# Patient Record
Sex: Male | Born: 1975 | Race: Black or African American | Hispanic: No | Marital: Single | Smoking: Never smoker
Health system: Southern US, Community
[De-identification: ages and names within clinical notes are randomized; demographics above are authoritative.]

## PROBLEM LIST (undated history)

## (undated) DIAGNOSIS — I10 Essential (primary) hypertension: Secondary | ICD-10-CM

## (undated) DIAGNOSIS — R7303 Prediabetes: Secondary | ICD-10-CM

---

## 2005-11-01 ENCOUNTER — Emergency Department (HOSPITAL_COMMUNITY): Admission: EM | Admit: 2005-11-01 | Discharge: 2005-11-01 | Payer: Self-pay | Admitting: Emergency Medicine

## 2007-03-15 ENCOUNTER — Emergency Department (HOSPITAL_COMMUNITY): Admission: EM | Admit: 2007-03-15 | Discharge: 2007-03-15 | Payer: Self-pay | Admitting: Emergency Medicine

## 2008-01-20 ENCOUNTER — Emergency Department (HOSPITAL_COMMUNITY): Admission: EM | Admit: 2008-01-20 | Discharge: 2008-01-20 | Payer: Self-pay | Admitting: Family Medicine

## 2010-03-29 ENCOUNTER — Emergency Department (HOSPITAL_COMMUNITY)
Admission: EM | Admit: 2010-03-29 | Discharge: 2010-03-29 | Payer: Self-pay | Source: Home / Self Care | Admitting: Family Medicine

## 2010-12-09 LAB — POCT I-STAT, CHEM 8
BUN: 15 mg/dL (ref 6–23)
Calcium, Ion: 1.23 mmol/L (ref 1.12–1.32)
Chloride: 108 mEq/L (ref 96–112)
Creatinine, Ser: 1.1 mg/dL (ref 0.4–1.5)
Glucose, Bld: 93 mg/dL (ref 70–99)
HCT: 44 % (ref 39.0–52.0)

## 2015-05-22 ENCOUNTER — Emergency Department (HOSPITAL_COMMUNITY)
Admission: EM | Admit: 2015-05-22 | Discharge: 2015-05-22 | Disposition: A | Discharge status: Home / Self Care | Payer: Self-pay | Attending: Emergency Medicine | Admitting: Emergency Medicine

## 2015-05-22 ENCOUNTER — Encounter (HOSPITAL_COMMUNITY): Payer: Self-pay | Admitting: Emergency Medicine

## 2015-05-22 DIAGNOSIS — R51 Headache: Secondary | ICD-10-CM | POA: Insufficient documentation

## 2015-05-22 NOTE — ED Notes (Signed)
Per pt, states headaches for a week-has not been taking anything for them

## 2015-05-25 ENCOUNTER — Emergency Department (HOSPITAL_COMMUNITY)
Admission: EM | Admit: 2015-05-25 | Discharge: 2015-05-26 | Disposition: A | Discharge status: Home / Self Care | Payer: Self-pay | Attending: Emergency Medicine | Admitting: Emergency Medicine

## 2015-05-25 ENCOUNTER — Encounter (HOSPITAL_COMMUNITY): Payer: Self-pay | Admitting: Emergency Medicine

## 2015-05-25 ENCOUNTER — Emergency Department (HOSPITAL_COMMUNITY): Payer: Self-pay

## 2015-05-25 ENCOUNTER — Emergency Department (INDEPENDENT_AMBULATORY_CARE_PROVIDER_SITE_OTHER)
Admission: EM | Admit: 2015-05-25 | Discharge: 2015-05-25 | Disposition: A | Discharge status: Home / Self Care | Payer: Self-pay | Source: Home / Self Care | Attending: Emergency Medicine | Admitting: Emergency Medicine

## 2015-05-25 ENCOUNTER — Encounter (HOSPITAL_COMMUNITY): Payer: Self-pay | Admitting: *Deleted

## 2015-05-25 DIAGNOSIS — G44019 Episodic cluster headache, not intractable: Secondary | ICD-10-CM | POA: Insufficient documentation

## 2015-05-25 DIAGNOSIS — G4452 New daily persistent headache (NDPH): Secondary | ICD-10-CM

## 2015-05-25 MED ORDER — METOCLOPRAMIDE HCL 10 MG PO TABS: 10.0000 mg | ORAL_TABLET | Freq: Three times a day (TID) | ORAL | Status: DC | PRN

## 2015-05-25 MED ORDER — IBUPROFEN 800 MG PO TABS
ORAL_TABLET | ORAL | Status: AC
  Filled 2015-05-25: qty 1

## 2015-05-25 MED ORDER — IBUPROFEN 800 MG PO TABS
800.0000 mg | ORAL_TABLET | Freq: Once | ORAL | Status: AC
  Administered 2015-05-25: 800 mg via ORAL

## 2015-05-25 MED ORDER — OXYCODONE-ACETAMINOPHEN 5-325 MG PO TABS
1.0000 | ORAL_TABLET | Freq: Once | ORAL | Status: AC
Start: 2015-05-25 — End: 2015-05-25
  Administered 2015-05-25: 1 via ORAL

## 2015-05-25 MED ORDER — OXYCODONE-ACETAMINOPHEN 5-325 MG PO TABS
ORAL_TABLET | ORAL | Status: AC
  Filled 2015-05-25: qty 1

## 2015-05-25 NOTE — ED Provider Notes (Signed)
CSN: [id number]     Arrival date & time 05/25/15  1551 History   First MD Initiated Contact with Patient 05/25/15 1720     Chief Complaint  Patient presents with  . Headache   (Consider location/radiation/quality/duration/timing/severity/associated sxs/prior Treatment) HPI He is a 40 year old man here for evaluation of headache.  He states the headache started about a week ago. Initially it would, and go, but it has been constant for the last 3-4 days. It is located in the right temple. He states it feels like someone is drooling in his skull. The pain radiates throughout the right side of his head and into his right shoulder. He states the pain will make his eye water. He reports sensitivity to light and noises. He does report nausea. He states when the pain gets intense, he has difficulty swallowing. Sometimes sleeping will improve the headache, but other times the headache will wake him up. He denies any change in his vision. No difficulty with speech. He does report feeling a little off balance a couple times this week. No focal numbness, tingling, weakness. No personal or family history of migraine headaches. He states he has had sinus headaches in the past, but this is very different. He has tried [facility] powders without improvement.  History reviewed. No pertinent past medical history. History reviewed. No pertinent past surgical history. No family history on file. Social History  Substance Use Topics  . Smoking status: Never Smoker   . Smokeless tobacco: None  . Alcohol Use: No    Review of Systems As in history of present illness Allergies  Review of patient's allergies indicates no known allergies.  Home Medications   Prior to Admission medications   Not on File   Meds Ordered and Administered this Visit   Medications  ibuprofen (ADVIL,MOTRIN) tablet 800 mg (800 mg Oral Given 05/25/15 1747)    BP 156/81 mmHg  Pulse 60  Temp(Src) 98.1 F (36.7 C) (Oral)  Resp 14  SpO2  98% No data found.   Physical Exam  Constitutional: He is oriented to person, place, and time. He appears well-developed and well-nourished. No distress.  HENT:  Mouth/Throat: Oropharynx is clear and moist. No oropharyngeal exudate.  Eyes: Conjunctivae and EOM are normal. Pupils are equal, round, and reactive to light.  Neck: Neck supple.  Cardiovascular: Normal rate, regular rhythm and normal heart sounds.   No murmur heard. Pulmonary/Chest: Effort normal and breath sounds normal. No respiratory distress. He has no wheezes. He has no rales.  Neurological: He is alert and oriented to person, place, and time. No cranial nerve deficit. He exhibits normal muscle tone. Coordination normal.  Romberg negative    ED Course  Procedures (including critical care time)  Labs Review Labs Reviewed - No data to display  Imaging Review No results found.   MDM   1. New daily persistent headache    Discussed with patient that new onset migraine in a 40 year old man is somewhat unusual. Given the new headache, swallowing and gait issues he has experienced, will transfer to ER for additional evaluation with a head CT. He is agreeable with this plan. Ibuprofen 800 mg given prior to transfer.    Charm RingsErin J Honig, MD 05/25/15 [phone]

## 2015-05-25 NOTE — Discharge Instructions (Signed)
Cluster Headache Cluster headaches are deeply painful. They normally occur on one side of your head, but they may switch sides. Often, cluster headaches:  Are severe.  Happen often for a few weeks or months and then go away for a while.  Last from 15 minutes to 3 hours.  Happen at the same time each day.  Happen at night.  Happen many times a day. HOME CARE  During times when you have cluster headaches:  Get the same amount of sleep every night, at the same time each night.  Avoid alcohol.  Stop smoking if you smoke. GET HELP IF:  There are changes in how bad or how often your headaches happen.  Your medicines are not helping. GET HELP RIGHT AWAY IF:  You pass out (faint).  You become weak or lose feeling (have numbness) on one side of your body or face.  You see two of everything (double vision).  You feel sick to your stomach (nauseous) or throw up (vomit) and do not stop after several hours.  You are off balance or have trouble talking or walking.  You have neck pain or stiffness.  You have a fever. MAKE SURE YOU:  Understand these instructions.  Will watch your condition.  Will get help right away if you are not doing well or get worse.   This information is not intended to replace advice given to you by your health care provider. Make sure you discuss any questions you have with your health care provider.   Document Released: 04/02/2004 Document Revised: 03/16/2014 Document Reviewed: 09/15/2012 Elsevier Interactive Patient Education 2016 [employer]  

## 2015-05-25 NOTE — ED Notes (Signed)
C/o intermittent HAs onset x7 days... Pain increases w/bright light and loud noises... Pain is mostly on right side A&O x4... No acute distress.

## 2015-05-25 NOTE — ED Provider Notes (Signed)
CSN: [id number]     Arrival date & time 05/25/15  1755 History   First MD Initiated Contact with Patient 05/25/15 2043     Chief Complaint  Patient presents with  . Headache     (Consider location/radiation/quality/duration/timing/severity/associated sxs/prior Treatment) Patient is a 40 y.o. male presenting with headaches. The history is provided by the patient.  Headache Location: behing right eye. Quality:  Sharp Radiates to:  Face Severity currently:  8/10 Severity at highest:  10/10 Onset quality:  Sudden Duration:  1 week Timing:  Intermittent Progression:  Waxing and waning Chronicity:  New Similar to prior headaches: no   Relieved by:  Nothing Worsened by:  Nothing Ineffective treatments:  NSAIDs Associated symptoms: no abdominal pain, no back pain, no diarrhea, no dizziness, no eye pain, no fever, no myalgias, no nausea, no neck pain, no neck stiffness, no sore throat, no vomiting and no weakness   Associated symptoms comment:  + tearing of eye   History reviewed. No pertinent past medical history. History reviewed. No pertinent past surgical history. History reviewed. No pertinent family history. Social History  Substance Use Topics  . Smoking status: Never Smoker   . Smokeless tobacco: None  . Alcohol Use: No    Review of Systems  Constitutional: Negative for fever, diaphoresis, activity change and appetite change.  HENT: Negative for facial swelling, sore throat, tinnitus, trouble swallowing and voice change.   Eyes: Positive for discharge. Negative for pain and redness.  Respiratory: Negative for chest tightness, shortness of breath and wheezing.   Cardiovascular: Negative for chest pain, palpitations and leg swelling.  Gastrointestinal: Negative for nausea, vomiting, abdominal pain, diarrhea, constipation and abdominal distention.  Endocrine: Negative.   Genitourinary: Negative.  Negative for dysuria, decreased urine volume, scrotal swelling and testicular  pain.  Musculoskeletal: Negative for myalgias, back pain, gait problem, neck pain and neck stiffness.  Skin: Negative.  Negative for rash.  Neurological: Positive for headaches. Negative for dizziness, tremors and weakness.  Psychiatric/Behavioral: Negative for suicidal ideas, hallucinations and self-injury. The patient is not nervous/anxious.       Allergies  Review of patient's allergies indicates no known allergies.  Home Medications   Prior to Admission medications   Medication Sig Start Date End Date Taking? Authorizing Provider  Aspirin-Salicylamide-Caffeine (BC HEADACHE POWDER PO) Take 1 packet by mouth 2 (two) times daily as needed (headache).   Yes Historical Provider, MD  metoCLOPramide (REGLAN) 10 MG tablet Take 1 tablet (10 mg total) by mouth every 8 (eight) hours as needed (headaches). 05/25/15   Lula Olszewski, MD   BP 141/84 mmHg  Pulse 88  Temp(Src) 98.5 F (36.9 C) (Oral)  Resp 18  SpO2 99% Physical Exam  Constitutional: He is oriented to person, place, and time. He appears well-developed and well-nourished. He appears distressed (middle aged, fit male in minor distress from headache pain).  HENT:  Head: Normocephalic and atraumatic.  Right Ear: External ear normal.  Left Ear: External ear normal.  Nose: Nose normal.  Mouth/Throat: Oropharynx is clear and moist.  Eyes: Conjunctivae and EOM are normal. Pupils are equal, round, and reactive to light. No scleral icterus.  Neck: Normal range of motion. Neck supple. No JVD present. No tracheal deviation present. No thyromegaly present.  Cardiovascular: Normal rate and intact distal pulses.  Exam reveals no gallop and no friction rub.   No murmur heard. Pulmonary/Chest: Effort normal and breath sounds normal. No stridor. No respiratory distress. He has no wheezes. He has no rales.  Abdominal: Soft. He exhibits no distension. There is no tenderness. There is no rebound and no guarding.  Musculoskeletal: Normal range of  motion. He exhibits no edema or tenderness.  Neurological: He is alert and oriented to person, place, and time. No cranial nerve deficit. He exhibits normal muscle tone. Coordination normal.  5/5 strength in all 4 extremities. Sensation intact and normal in all 4 extremities. Normal gait. Normal finger to nose and heel to shin. Negative romberg.   Skin: Skin is warm and dry. No rash noted. He is not diaphoretic.  Psychiatric: He has a normal mood and affect. His behavior is normal.  Nursing note and vitals reviewed.   ED Course  Procedures (including critical care time) Labs Review Labs Reviewed - No data to display  Imaging Review Ct Head Wo Contrast  05/25/2015  CLINICAL DATA:  Acute onset of intermittent right-sided headaches. Initial encounter. EXAM: CT HEAD WITHOUT CONTRAST TECHNIQUE: Contiguous axial images were obtained from the base of the skull through the vertex without intravenous contrast. COMPARISON:  None. FINDINGS: There is no evidence of acute infarction, mass lesion, or intra- or extra-axial hemorrhage on CT. The posterior fossa, including the cerebellum, brainstem and fourth ventricle, is within normal limits. The third and lateral ventricles, and basal ganglia are unremarkable in appearance. The cerebral hemispheres are symmetric in appearance, with normal gray-white differentiation. No mass effect or midline shift is seen. There is no evidence of fracture; visualized osseous structures are unremarkable in appearance. The orbits are within normal limits. The paranasal sinuses and mastoid air cells are well-aerated. No significant soft tissue abnormalities are seen. IMPRESSION: Unremarkable noncontrast CT of the head. Electronically Signed   By: Roanna RaiderJeffery  Chang M.D.   On: 05/25/2015 22:52   I have personally reviewed and evaluated these images and lab results as part of my medical decision-making.   EKG Interpretation None      MDM   Final diagnoses:  Episodic cluster  headache, not intractable    The patient is a 40 year old male who denies any prior past medical history who presents after being referred from urgent care for headache. The patient reports for the past 7 days he has had daily headaches worse overnight often waking him up from sleep. He describes the pain as a sharp pain behind the right eye tearing. No neurologic deficits. Due to new onset headaches waking him up from sleep urgent care referred him to the emergency department for head CT to rule out intracranial pathology. Head CT shows no acute intracranial process. Feel patient's history is consistent with cluster headaches. He is given oxygen via a nonrebreather and after 15 minutes reports significant improvement in his headache. The patient is appropriate for treatment with Reglan and referral for primary care follow-up. Patient expresses understanding and agreeable to this plan. Standard ED return precautions given.  I estimate there is LOW risk for SUBARACHNOID HEMORRHAGE, MENINGITIS, INTRACRANIAL HEMORRHAGE, or SUBDURAL HEMATOMA for the patient's symptoms thus I consider the discharge disposition reasonable. We have discussed the diagnosis and risks, and we agree with discharging home to follow-up with their primary doctor. We also discussed returning to the Emergency Department immediately if new or worsening symptoms occur. We have discussed the symptoms which are most concerning (e.g., changing or worsening pain, weakness, vomiting, vision/hearing changes, fever) that necessitate immediate return.  Patient seen with attending, Dr. Jodi MourningZavitz, who oversaw clinical decision making.     Lula OlszewskiMike Anahi Belmar, MD 05/26/15 [id number]  Blane OharaJoshua Zavitz, MD 05/27/15 [phone]

## 2015-05-25 NOTE — ED Notes (Signed)
Pt reports having a headache x 1 week, sensitive to light and mild nausea. No relief with otc meds.

## 2015-05-25 NOTE — ED Notes (Signed)
Pt declined shuttle service 

## 2015-05-26 NOTE — ED Notes (Signed)
Pt reports feeling better now

## 2015-11-07 ENCOUNTER — Encounter (HOSPITAL_COMMUNITY): Payer: Self-pay | Admitting: Emergency Medicine

## 2015-11-07 ENCOUNTER — Ambulatory Visit (HOSPITAL_COMMUNITY)
Admission: EM | Admit: 2015-11-07 | Discharge: 2015-11-07 | Disposition: A | Discharge status: Home / Self Care | Payer: Self-pay | Attending: Family Medicine | Admitting: Family Medicine

## 2015-11-07 DIAGNOSIS — N410 Acute prostatitis: Secondary | ICD-10-CM

## 2015-11-07 HISTORY — DX: Essential (primary) hypertension: I10

## 2015-11-07 LAB — POCT URINALYSIS DIP (DEVICE)
Glucose, UA: NEGATIVE mg/dL
KETONES UR: NEGATIVE mg/dL
Leukocytes, UA: NEGATIVE
NITRITE: NEGATIVE
PH: 6 (ref 5.0–8.0)
PROTEIN: 100 mg/dL — AB
Specific Gravity, Urine: >=1.03 (ref 1.005–1.030)
Urobilinogen, UA: 2 mg/dL — ABNORMAL HIGH (ref 0.0–1.0)

## 2015-11-07 MED ORDER — CIPROFLOXACIN HCL 500 MG PO TABS: 500.0000 mg | ORAL_TABLET | Freq: Two times a day (BID) | ORAL | 0 refills | Status: DC

## 2015-11-07 NOTE — ED Provider Notes (Signed)
CSN: [id number]     Arrival date & time 11/07/15  1903 History   First MD Initiated Contact with Patient 11/07/15 1945     Chief Complaint  Patient presents with  . Urinary Frequency  . Burning with Urination   (Consider location/radiation/quality/duration/timing/severity/associated sxs/prior Treatment) Patient states he has been having some urinary frequency and his urine stream is not like usual.  He states he was checked recently for STD and was not called with any abnormal results and told he had normal test results. He is uncircumcised.  He states he has to get up out of bed more at night to void.   The history is provided by the patient.  Urinary Frequency  This is a new problem. The current episode started more than 2 days ago. The problem occurs constantly. The problem has not changed since onset.Nothing aggravates the symptoms. Nothing relieves the symptoms. He has tried nothing for the symptoms.    Past Medical History:  Diagnosis Date  . Hypertension    History reviewed. No pertinent surgical history. History reviewed. No pertinent family history. Social History  Substance Use Topics  . Smoking status: Never Smoker  . Smokeless tobacco: Never Used  . Alcohol use No    Review of Systems  Constitutional: Negative.   HENT: Negative.   Eyes: Negative.   Respiratory: Negative.   Cardiovascular: Negative.   Gastrointestinal: Negative.   Endocrine: Negative.   Genitourinary: Positive for frequency.  Musculoskeletal: Negative.   Skin: Negative.   Allergic/Immunologic: Negative.   Neurological: Negative.   Hematological: Negative.   Psychiatric/Behavioral: Negative.     Allergies  Review of patient's allergies indicates no known allergies.  Home Medications   Prior to Admission medications   Medication Sig Start Date End Date Taking? Authorizing Provider  Aspirin-Salicylamide-Caffeine (BC HEADACHE POWDER PO) Take 1 packet by mouth 2 (two) times daily as needed  (headache).   Yes Historical Provider, MD  metoCLOPramide (REGLAN) 10 MG tablet Take 1 tablet (10 mg total) by mouth every 8 (eight) hours as needed (headaches). 05/25/15   Lula OlszewskiMike Goebel, MD   Meds Ordered and Administered this Visit  Medications - No data to display  BP (!) 194/104 (BP Location: Right Arm)   Pulse 70   Temp 99.2 F (37.3 C) (Oral)   SpO2 98%  No data found.   Physical Exam  Constitutional: He appears well-developed and well-nourished.  HENT:  Head: Normocephalic and atraumatic.  Eyes: EOM are normal. Pupils are equal, round, and reactive to light.  Neck: Normal range of motion. Neck supple.  Cardiovascular: Normal rate, regular rhythm and normal heart sounds.   Pulmonary/Chest: Effort normal and breath sounds normal.  Abdominal: Soft. Bowel sounds are normal.  Genitourinary: Penis normal.  Nursing note and vitals reviewed.   Urgent Care Course   Clinical Course    Procedures (including critical care time)  Labs Review Labs Reviewed - No data to display  Imaging Review No results found.   Visual Acuity Review  Right Eye Distance:   Left Eye Distance:   Bilateral Distance:    Right Eye Near:   Left Eye Near:    Bilateral Near:         MDM  Prostatitis - Cipro 500mg  one po bid x 14 days #28 Urine results are normal and advised patient to get PCP and follow up.     Deatra CanterWilliam J Oxford, FNP 11/07/15 2009

## 2015-11-07 NOTE — ED Triage Notes (Signed)
Pt reports having some burning with urination, an odor with his urine, frequency of urination and some mld bilateral back pain.  He states his girlfriend has a UTI , but no STD's.

## 2015-11-07 NOTE — ED Notes (Signed)
Pt given AVS and prescription.  Pt encouraged to return to PCP for follow up on his HBP.  Pt stated understanding.

## 2016-12-30 IMAGING — CT CT HEAD W/O CM
2 series · 15 of 30 positions shown, 19 images · non-contrast
Comparison: None.

CLINICAL DATA: Acute onset of intermittent right-sided headaches.
Initial encounter.

EXAM:
CT HEAD WITHOUT CONTRAST
TECHNIQUE: Contiguous axial images were obtained from the base of the skull
through the vertex without intravenous contrast.

[Series 201: head w/o, idose (1) · axial · non-contrast · 0.49mm/px · z∈[+82,+212]mm · 13 of 32 slices shown, 17 images]
[im 3/32  brain]
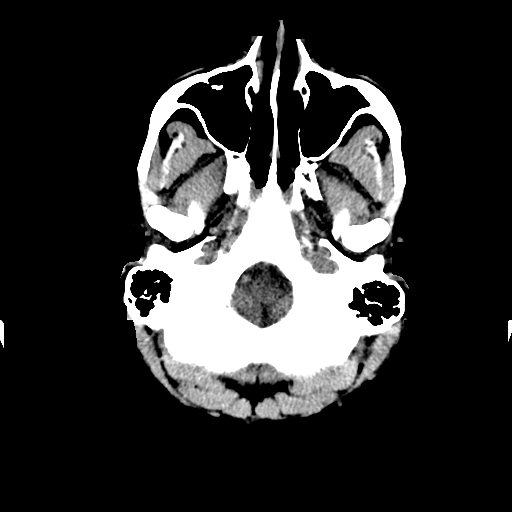
[im 3/32  bone]
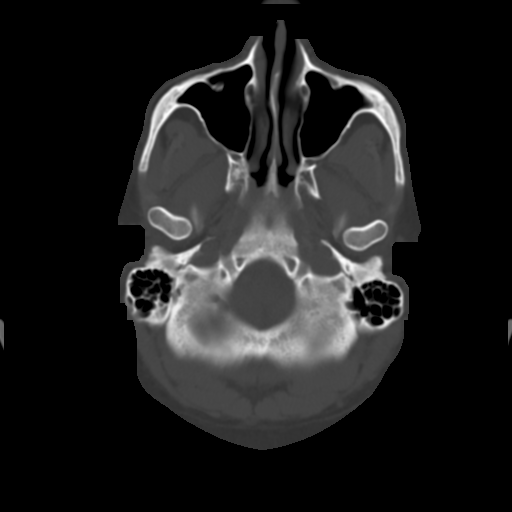
[im 5/32  brain]
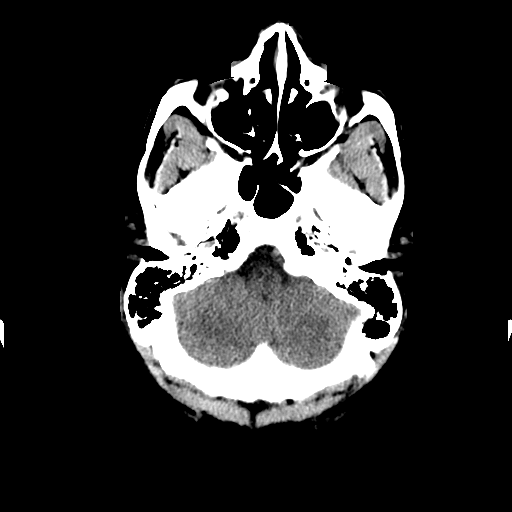
[im 7/32  brain]
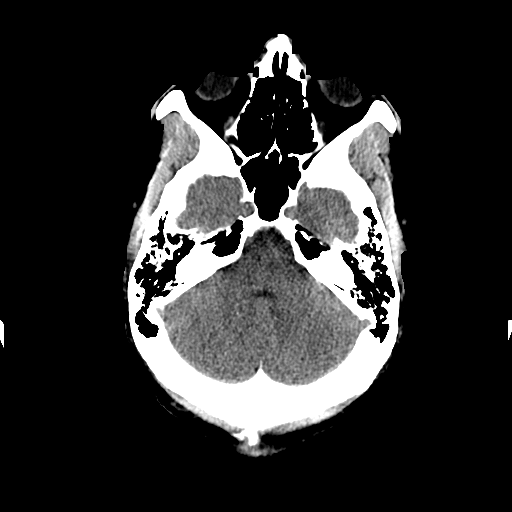
[im 9/32  brain]
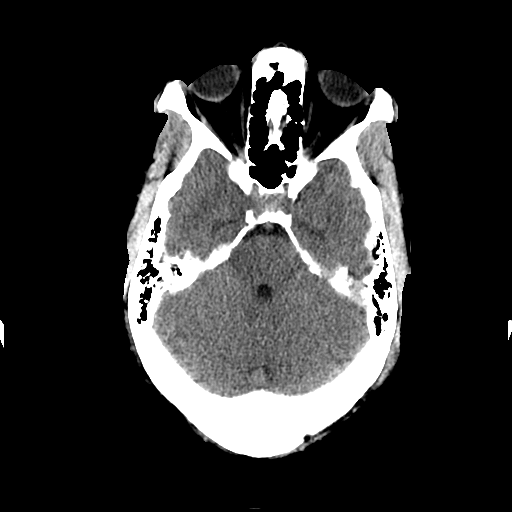
[im 12/32  brain]
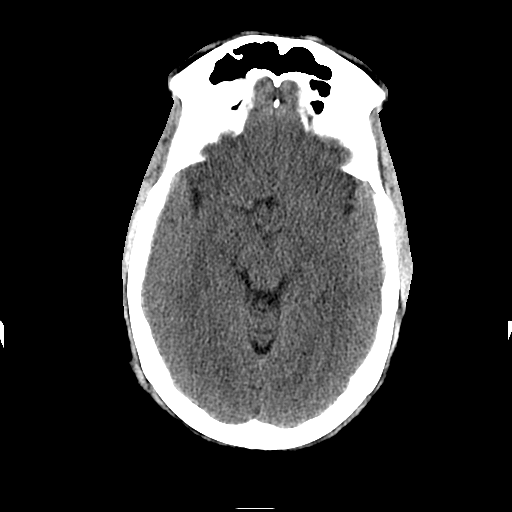
[im 12/32  bone]
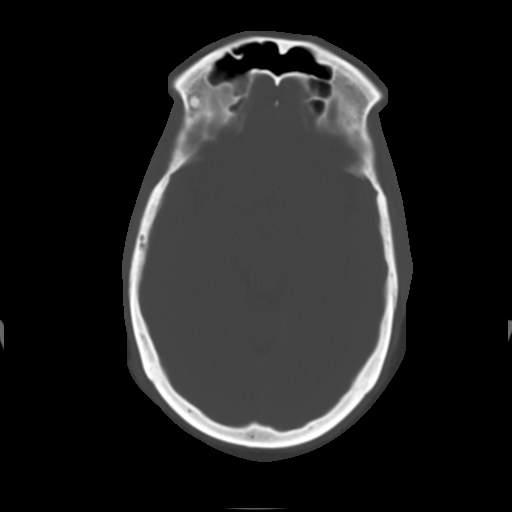
[im 14/32  brain]
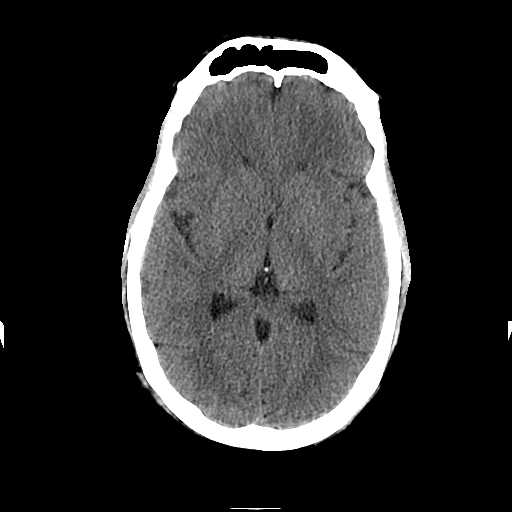
[im 16/32  brain]
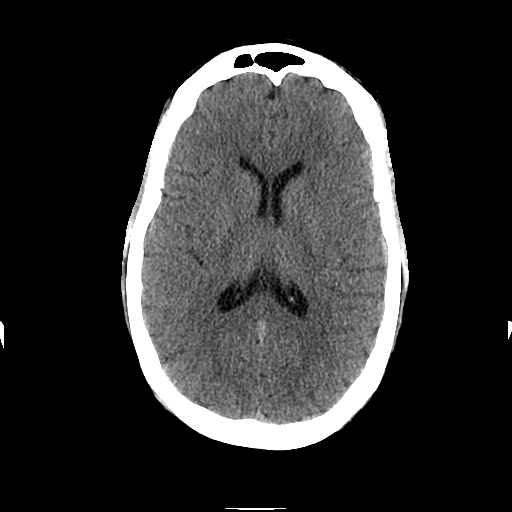
[im 18/32  brain]
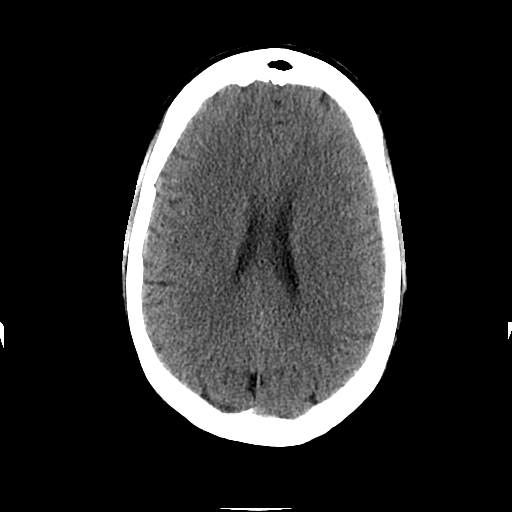
[im 20/32  brain]
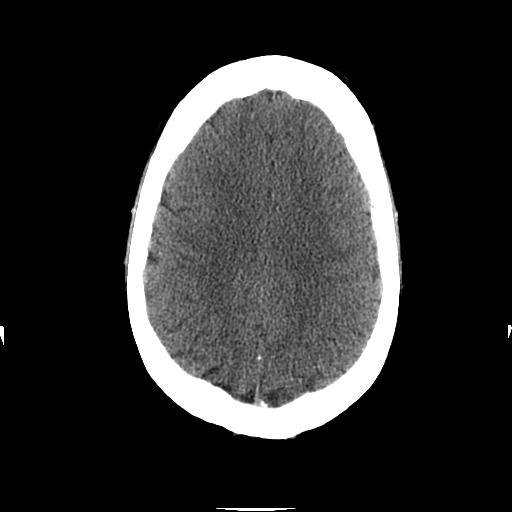
[im 20/32  bone]
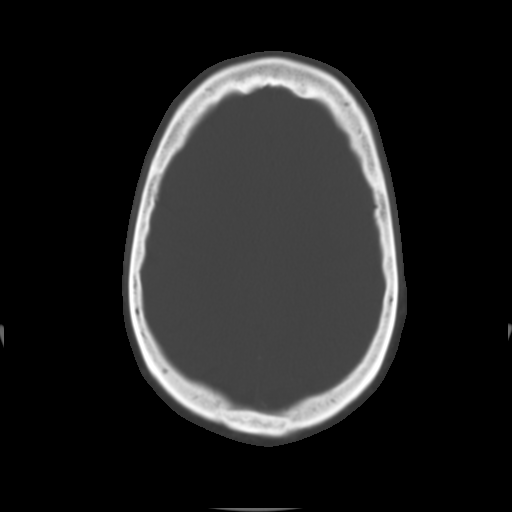
[im 23/32  brain]
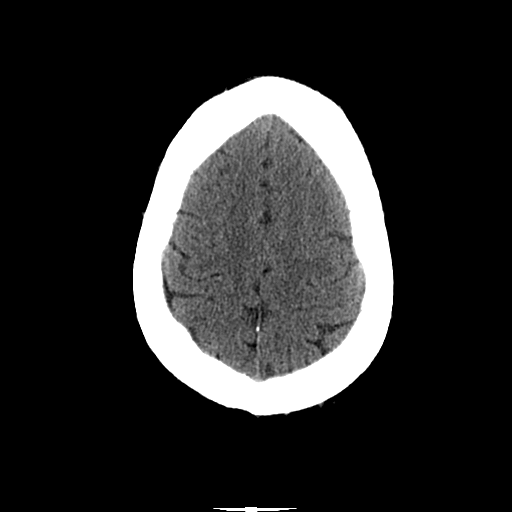
[im 25/32  brain]
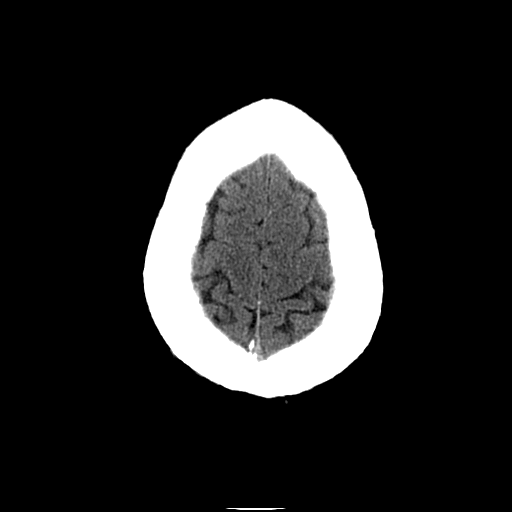
[im 27/32  brain]
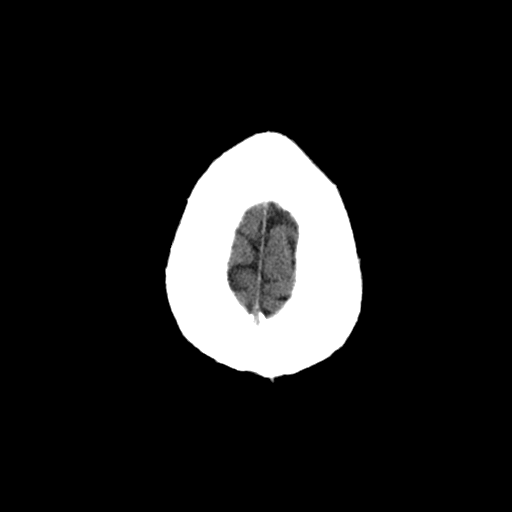
[im 29/32  brain]
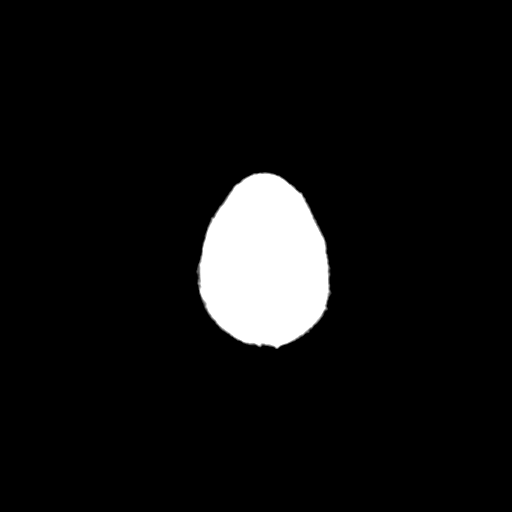
[im 29/32  bone]
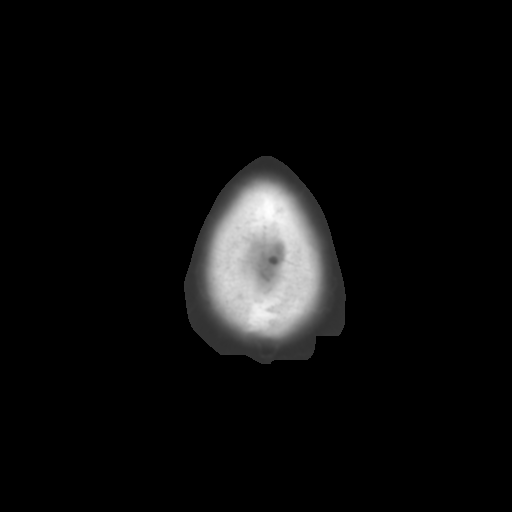

[Series 202: head w/o bone, idose (1) · axial · non-contrast · 0.49mm/px · z∈[+82,+102]mm · 2 of 32 slices shown]
[im 3/32  bone]
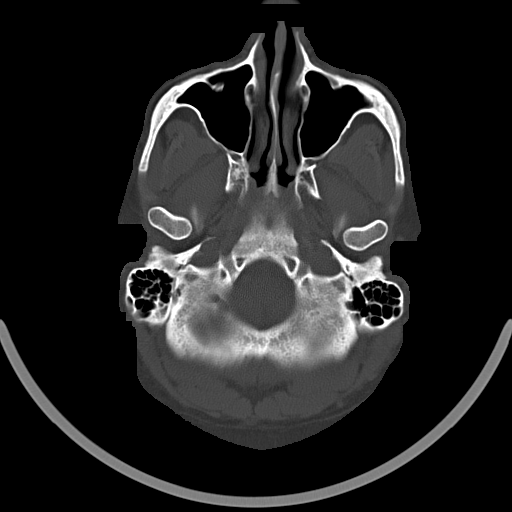
[im 7/32  bone]
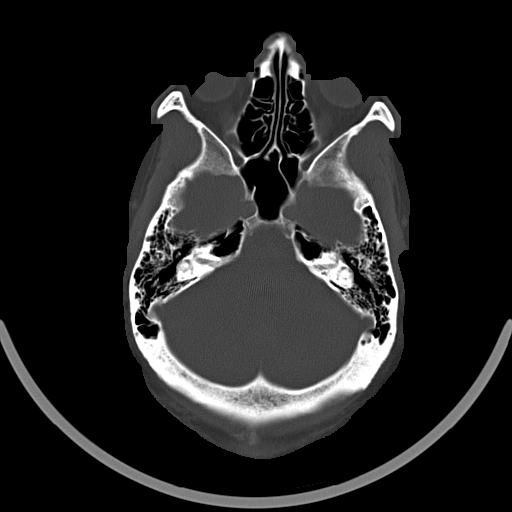

[15 of 30 positions shown; findings below may reference images not displayed]

FINDINGS: There is no evidence of acute infarction, mass lesion, or intra- or
extra-axial hemorrhage on CT.

The posterior fossa, including the cerebellum, brainstem and fourth
ventricle, is within normal limits. The third and lateral
ventricles, and basal ganglia are unremarkable in appearance. The
cerebral hemispheres are symmetric in appearance, with normal
gray-white differentiation. No mass effect or midline shift is seen.

There is no evidence of fracture; visualized osseous structures are
unremarkable in appearance. The orbits are within normal limits. The
paranasal sinuses and mastoid air cells are well-aerated. No
significant soft tissue abnormalities are seen.
IMPRESSION: Unremarkable noncontrast CT of the head.

## 2018-08-07 ENCOUNTER — Ambulatory Visit (HOSPITAL_COMMUNITY)
Admission: EM | Admit: 2018-08-07 | Discharge: 2018-08-07 | Disposition: A | Discharge status: Home / Self Care | Payer: Self-pay | Attending: Family Medicine | Admitting: Family Medicine

## 2018-08-07 ENCOUNTER — Other Ambulatory Visit: Payer: Self-pay

## 2018-08-07 ENCOUNTER — Encounter (HOSPITAL_COMMUNITY): Payer: Self-pay | Admitting: Emergency Medicine

## 2018-08-07 DIAGNOSIS — R369 Urethral discharge, unspecified: Secondary | ICD-10-CM

## 2018-08-07 MED ORDER — CEFTRIAXONE SODIUM 250 MG IJ SOLR
INTRAMUSCULAR | Status: AC
  Filled 2018-08-07: qty 250

## 2018-08-07 MED ORDER — AZITHROMYCIN 250 MG PO TABS
ORAL_TABLET | ORAL | Status: AC
Start: 2018-08-07 — End: ?
  Filled 2018-08-07: qty 4

## 2018-08-07 MED ORDER — CEFTRIAXONE SODIUM 250 MG IJ SOLR
250.0000 mg | Freq: Once | INTRAMUSCULAR | Status: AC
  Administered 2018-08-07: 13:00:00 250 mg via INTRAMUSCULAR

## 2018-08-07 MED ORDER — AZITHROMYCIN 250 MG PO TABS
1000.0000 mg | ORAL_TABLET | Freq: Once | ORAL | Status: AC
  Administered 2018-08-07: 13:00:00 1000 mg via ORAL

## 2018-08-07 NOTE — Discharge Instructions (Addendum)
Avoid sex until next weekend.  We'll call you with the final diagnosis in a couple days.

## 2018-08-07 NOTE — ED Provider Notes (Signed)
[facility]    CSN: [id number] Arrival date & time: 08/07/18  1147     History   Chief Complaint Chief Complaint  Patient presents with  . Penile Discharge    HPI Benjamin Phillips is a 43 y.o. male.   Established [facility] patient  44 yo man with penile discharge.  It began 4 days ago but became heavier and thicker x 2 days.  New partner recently.  Works as a Investment banker, operational     Past Medical History:  Diagnosis Date  . Hypertension     There are no active problems to display for this patient.   History reviewed. No pertinent surgical history.     Home Medications    Prior to Admission medications   Medication Sig Start Date End Date Taking? Authorizing Provider  Aspirin-Salicylamide-Caffeine (BC HEADACHE POWDER PO) Take 1 packet by mouth 2 (two) times daily as needed (headache).    [provider]  metoCLOPramide (REGLAN) 10 MG tablet Take 1 tablet (10 mg total) by mouth every 8 (eight) hours as needed (headaches). 05/25/15   Lula Olszewski, MD    Family History History reviewed. No pertinent family history.  Social History Social History   Tobacco Use  . Smoking status: Never Smoker  . Smokeless tobacco: Never Used  Substance Use Topics  . Alcohol use: No  . Drug use: No    Types: Marijuana     Allergies   Patient has no known allergies.   Review of Systems Review of Systems  Genitourinary: Positive for discharge and dysuria.  All other systems reviewed and are negative.    Physical Exam Triage Vital Signs ED Triage Vitals  Enc Vitals Group     BP 08/07/18 1208 (!) 162/99     Pulse Rate 08/07/18 1208 70     Resp 08/07/18 1208 18     Temp 08/07/18 1208 98.5 F (36.9 C)     Temp Source 08/07/18 1208 Oral     SpO2 08/07/18 1208 97 %     Weight --      Height --      Head Circumference --      Peak Flow --      Pain Score 08/07/18 1209 0     Pain Loc --      Pain Edu? --      Excl. in GC? --    No data found.  Updated  Vital Signs BP (!) 162/99 (BP Location: Right Arm)   Pulse 70   Temp 98.5 F (36.9 C) (Oral)   Resp 18   SpO2 97%    Physical Exam Vitals signs and nursing note reviewed.  Constitutional:      Appearance: Normal appearance. He is obese.  Neck:     Musculoskeletal: Normal range of motion and neck supple.  Pulmonary:     Effort: Pulmonary effort is normal.  Genitourinary:    Comments: Thick green discharge from meatus Musculoskeletal: Normal range of motion.  Skin:    General: Skin is warm and dry.  Neurological:     General: No focal deficit present.     Mental Status: He is alert and oriented to person, place, and time.     Cranial Nerves: No cranial nerve deficit.  Psychiatric:        Mood and Affect: Mood normal.      UC Treatments / Results  Labs (all labs ordered are listed, but only abnormal results are displayed) Labs Reviewed  URINE CYTOLOGY ANCILLARY ONLY    EKG None  Radiology No results found.  Procedures Procedures (including critical care time)  Medications Ordered in UC Medications  azithromycin (ZITHROMAX) tablet 1,000 mg (has no administration in time range)  cefTRIAXone (ROCEPHIN) injection 250 mg (has no administration in time range)    Initial Impression / Assessment and Plan / UC Course  I have reviewed the triage vital signs and the nursing notes.  Pertinent labs & imaging results that were available during my care of the patient were reviewed by me and considered in my medical decision making (see chart for details).    Final Clinical Impressions(s) / UC Diagnoses   Final diagnoses:  Penile discharge     Discharge Instructions     Avoid sex until next weekend.  We'll call you with the final diagnosis in a couple days.    ED Prescriptions    None     Controlled Substance Prescriptions [place] Controlled Substance Registry consulted? Not Applicable   Elvina SidleLauenstein, Laquiesha Piacente, MD 08/07/18 1236

## 2018-08-07 NOTE — ED Triage Notes (Signed)
Pt here for penile discharge

## 2018-08-08 LAB — URINE CYTOLOGY ANCILLARY ONLY
Chlamydia: POSITIVE — AB
Neisseria Gonorrhea: POSITIVE — AB
Trichomonas: NEGATIVE

## 2018-08-15 ENCOUNTER — Telehealth (HOSPITAL_COMMUNITY): Payer: Self-pay | Admitting: Emergency Medicine

## 2018-08-15 NOTE — Telephone Encounter (Signed)
Test for gonorrhea was positive. This was treated at the urgent care visit with IM rocephin 250mg  and po zithromax 1g. Pt needs education to refrain from sexual intercourse for 7 days after treatment to give the medicine time to work. Sexual partners need to be notified and tested/treated. Condoms may reduce risk of reinfection. Recheck or followup with PCP for further evaluation if symptoms are not improving. [facility] notified.   Chlamydia is positive.  This was treated at the urgent care visit with po zithromax 1g.  Pt needs education to please refrain from sexual intercourse for 7 days to give the medicine time to work.  Sexual partners need to be notified and tested/treated.  Condoms may reduce risk of reinfection.  Recheck or followup with PCP for further evaluation if symptoms are not improving.  [facility] notified.  Attempted to reach patient. Someone answered and said it was the wrong number. Will send letter.

## 2022-04-17 ENCOUNTER — Other Ambulatory Visit: Payer: Self-pay

## 2022-04-17 ENCOUNTER — Observation Stay (HOSPITAL_COMMUNITY)
Admission: EM | Admit: 2022-04-17 | Discharge: 2022-04-19 | Disposition: A | Discharge status: Home / Self Care | Payer: BLUE CROSS/BLUE SHIELD | Attending: Family Medicine | Admitting: Family Medicine

## 2022-04-17 ENCOUNTER — Encounter (HOSPITAL_COMMUNITY): Payer: Self-pay | Admitting: Emergency Medicine

## 2022-04-17 DIAGNOSIS — E119 Type 2 diabetes mellitus without complications: Secondary | ICD-10-CM

## 2022-04-17 DIAGNOSIS — Z1152 Encounter for screening for COVID-19: Secondary | ICD-10-CM | POA: Insufficient documentation

## 2022-04-17 DIAGNOSIS — E1100 Type 2 diabetes mellitus with hyperosmolarity without nonketotic hyperglycemic-hyperosmolar coma (NKHHC): Secondary | ICD-10-CM | POA: Diagnosis not present

## 2022-04-17 DIAGNOSIS — I10 Essential (primary) hypertension: Secondary | ICD-10-CM | POA: Diagnosis not present

## 2022-04-17 DIAGNOSIS — R631 Polydipsia: Secondary | ICD-10-CM | POA: Diagnosis present

## 2022-04-17 DIAGNOSIS — Z23 Encounter for immunization: Secondary | ICD-10-CM | POA: Diagnosis not present

## 2022-04-17 DIAGNOSIS — Z79899 Other long term (current) drug therapy: Secondary | ICD-10-CM | POA: Diagnosis not present

## 2022-04-17 DIAGNOSIS — R030 Elevated blood-pressure reading, without diagnosis of hypertension: Secondary | ICD-10-CM | POA: Insufficient documentation

## 2022-04-17 LAB — I-STAT VENOUS BLOOD GAS, ED
Acid-base deficit: 1 mmol/L (ref 0.0–2.0)
Bicarbonate: 25.6 mmol/L (ref 20.0–28.0)
Calcium, Ion: 1.22 mmol/L (ref 1.15–1.40)
HCT: 49 % (ref 39.0–52.0)
Hemoglobin: 16.7 g/dL (ref 13.0–17.0)
O2 Saturation: 39 %
Potassium: 5.1 mmol/L (ref 3.5–5.1)
Sodium: 130 mmol/L — ABNORMAL LOW (ref 135–145)
TCO2: 27 mmol/L (ref 22–32)
pCO2, Ven: 47.1 mmHg (ref 44–60)
pH, Ven: 7.342 (ref 7.25–7.43)
pO2, Ven: 24 mmHg — CL (ref 32–45)

## 2022-04-17 LAB — BETA-HYDROXYBUTYRIC ACID: Beta-Hydroxybutyric Acid: 2.4 mmol/L — ABNORMAL HIGH (ref 0.05–0.27)

## 2022-04-17 LAB — CBC WITH DIFFERENTIAL/PLATELET
Abs Immature Granulocytes: 0.02 10*3/uL (ref 0.00–0.07)
Basophils Absolute: 0 10*3/uL (ref 0.0–0.1)
Basophils Relative: 1 %
Eosinophils Absolute: 0.1 10*3/uL (ref 0.0–0.5)
Eosinophils Relative: 1 %
HCT: 44.5 % (ref 39.0–52.0)
Hemoglobin: 15 g/dL (ref 13.0–17.0)
Immature Granulocytes: 0 %
Lymphocytes Relative: 36 %
Lymphs Abs: 2.2 10*3/uL (ref 0.7–4.0)
MCH: 29.4 pg (ref 26.0–34.0)
MCHC: 33.7 g/dL (ref 30.0–36.0)
MCV: 87.1 fL (ref 80.0–100.0)
Monocytes Absolute: 0.4 10*3/uL (ref 0.1–1.0)
Monocytes Relative: 7 %
Neutro Abs: 3.4 10*3/uL (ref 1.7–7.7)
Neutrophils Relative %: 55 %
Platelets: 420 10*3/uL — ABNORMAL HIGH (ref 150–400)
RBC: 5.11 MIL/uL (ref 4.22–5.81)
RDW: 12.6 % (ref 11.5–15.5)
WBC: 6.2 10*3/uL (ref 4.0–10.5)
nRBC: 0 % (ref 0.0–0.2)

## 2022-04-17 LAB — URINALYSIS, MICROSCOPIC (REFLEX)
Squamous Epithelial / HPF: NONE SEEN /HPF (ref 0–5)
WBC, UA: NONE SEEN WBC/hpf (ref 0–5)

## 2022-04-17 LAB — MAGNESIUM
Magnesium: 2.2 mg/dL (ref 1.7–2.4)
Magnesium: 2.1 mg/dL (ref 1.7–2.4)

## 2022-04-17 LAB — CBG MONITORING, ED
Glucose-Capillary: >600 mg/dL (ref 70–99)
Glucose-Capillary: 588 mg/dL (ref 70–99)
Glucose-Capillary: 484 mg/dL — ABNORMAL HIGH (ref 70–99)
Glucose-Capillary: 372 mg/dL — ABNORMAL HIGH (ref 70–99)

## 2022-04-17 LAB — COMPREHENSIVE METABOLIC PANEL
ALT: 33 U/L (ref 0–44)
AST: 26 U/L (ref 15–41)
Albumin: 4.4 g/dL (ref 3.5–5.0)
Alkaline Phosphatase: 122 U/L (ref 38–126)
Anion gap: 18 — ABNORMAL HIGH (ref 5–15)
BUN: 18 mg/dL (ref 6–20)
CO2: 17 mmol/L — ABNORMAL LOW (ref 22–32)
Calcium: 9.8 mg/dL (ref 8.9–10.3)
Chloride: 90 mmol/L — ABNORMAL LOW (ref 98–111)
Creatinine, Ser: 1.23 mg/dL (ref 0.61–1.24)
GFR, Estimated: >60 mL/min (ref >60)
Glucose, Bld: 758 mg/dL (ref 70–99)
Potassium: 4.5 mmol/L (ref 3.5–5.1)
Sodium: 125 mmol/L — ABNORMAL LOW (ref 135–145)
Total Bilirubin: 0.9 mg/dL (ref 0.3–1.2)
Total Protein: 8.3 g/dL — ABNORMAL HIGH (ref 6.5–8.1)

## 2022-04-17 LAB — URINALYSIS, ROUTINE W REFLEX MICROSCOPIC
Bilirubin Urine: NEGATIVE
Glucose, UA: >=500 mg/dL — AB
Ketones, ur: 15 mg/dL — AB
Leukocytes,Ua: NEGATIVE
Nitrite: NEGATIVE
Protein, ur: NEGATIVE mg/dL
Specific Gravity, Urine: <1.005 — ABNORMAL LOW (ref 1.005–1.030)
pH: 5.5 (ref 5.0–8.0)

## 2022-04-17 LAB — BASIC METABOLIC PANEL
Anion gap: 10 (ref 5–15)
BUN: 13 mg/dL (ref 6–20)
CO2: 25 mmol/L (ref 22–32)
Calcium: 9.6 mg/dL (ref 8.9–10.3)
Chloride: 100 mmol/L (ref 98–111)
Creatinine, Ser: 1.05 mg/dL (ref 0.61–1.24)
GFR, Estimated: >60 mL/min (ref >60)
Glucose, Bld: 306 mg/dL — ABNORMAL HIGH (ref 70–99)
Potassium: 4 mmol/L (ref 3.5–5.1)
Sodium: 135 mmol/L (ref 135–145)

## 2022-04-17 LAB — GLUCOSE, CAPILLARY
Glucose-Capillary: 334 mg/dL — ABNORMAL HIGH (ref 70–99)
Glucose-Capillary: 325 mg/dL — ABNORMAL HIGH (ref 70–99)
Glucose-Capillary: 247 mg/dL — ABNORMAL HIGH (ref 70–99)

## 2022-04-17 LAB — RESP PANEL BY RT-PCR (RSV, FLU A&B, COVID)  RVPGX2
Influenza A by PCR: NEGATIVE
Influenza B by PCR: NEGATIVE
Resp Syncytial Virus by PCR: NEGATIVE
SARS Coronavirus 2 by RT PCR: NEGATIVE

## 2022-04-17 LAB — OSMOLALITY: Osmolality: 321 mOsm/kg (ref 275–295)

## 2022-04-17 LAB — HIV ANTIBODY (ROUTINE TESTING W REFLEX): HIV Screen 4th Generation wRfx: NONREACTIVE

## 2022-04-17 MED ORDER — SODIUM CHLORIDE 0.9 % IV BOLUS
1000.0000 mL | Freq: Once | INTRAVENOUS | Status: AC
  Administered 2022-04-17: 1000 mL via INTRAVENOUS

## 2022-04-17 MED ORDER — INSULIN GLARGINE-YFGN 100 UNIT/ML ~~LOC~~ SOLN
15.0000 [IU] | Freq: Every day | SUBCUTANEOUS | Status: DC
  Filled 2022-04-17: qty 0.15

## 2022-04-17 MED ORDER — LACTATED RINGERS IV BOLUS: Freq: Once | INTRAVENOUS | Status: AC

## 2022-04-17 MED ORDER — INSULIN ASPART 100 UNIT/ML IJ SOLN
0.0000 [IU] | Freq: Three times a day (TID) | INTRAMUSCULAR | Status: DC
  Administered 2022-04-18: 7 [IU] via SUBCUTANEOUS
  Administered 2022-04-18: 9 [IU] via SUBCUTANEOUS
  Administered 2022-04-18: 7 [IU] via SUBCUTANEOUS

## 2022-04-17 MED ORDER — ONDANSETRON HCL 4 MG/2ML IJ SOLN: 4.0000 mg | Freq: Four times a day (QID) | INTRAMUSCULAR | Status: DC | PRN

## 2022-04-17 MED ORDER — INSULIN GLARGINE-YFGN 100 UNIT/ML ~~LOC~~ SOLN
10.0000 [IU] | Freq: Every day | SUBCUTANEOUS | Status: DC
  Administered 2022-04-17: 10 [IU] via SUBCUTANEOUS
  Filled 2022-04-17 (×2): qty 0.1

## 2022-04-17 MED ORDER — DEXTROSE 50 % IV SOLN: 0.0000 mL | INTRAVENOUS | Status: DC | PRN

## 2022-04-17 MED ORDER — INSULIN REGULAR(HUMAN) IN NACL 100-0.9 UT/100ML-% IV SOLN
INTRAVENOUS | Status: DC
  Administered 2022-04-17: 8 [IU]/h via INTRAVENOUS
  Filled 2022-04-17: qty 100

## 2022-04-17 MED ORDER — DEXTROSE IN LACTATED RINGERS 5 % IV SOLN: INTRAVENOUS | Status: DC

## 2022-04-17 MED ORDER — POTASSIUM CHLORIDE 10 MEQ/100ML IV SOLN
10.0000 meq | INTRAVENOUS | Status: AC
  Administered 2022-04-17 (×2): 10 meq via INTRAVENOUS
  Filled 2022-04-17 (×2): qty 100

## 2022-04-17 MED ORDER — ENOXAPARIN SODIUM 40 MG/0.4ML IJ SOSY
40.0000 mg | PREFILLED_SYRINGE | INTRAMUSCULAR | Status: DC
  Administered 2022-04-18: 40 mg via SUBCUTANEOUS
  Filled 2022-04-17: qty 0.4

## 2022-04-17 MED ORDER — INFLUENZA VAC SPLIT QUAD 0.5 ML IM SUSY
0.5000 mL | PREFILLED_SYRINGE | INTRAMUSCULAR | Status: AC
  Administered 2022-04-18: 0.5 mL via INTRAMUSCULAR
  Filled 2022-04-17: qty 0.5

## 2022-04-17 MED ORDER — PNEUMOCOCCAL 20-VAL CONJ VACC 0.5 ML IM SUSY
0.5000 mL | PREFILLED_SYRINGE | INTRAMUSCULAR | Status: AC
  Administered 2022-04-18: 0.5 mL via INTRAMUSCULAR
  Filled 2022-04-17: qty 0.5

## 2022-04-17 MED ORDER — LACTATED RINGERS IV SOLN: INTRAVENOUS | Status: DC

## 2022-04-17 NOTE — H&P (Cosign Needed Addendum)
Hospital Admission History and Physical Service Pager: [phone]  Patient name: Benjamin Phillips Medical record number: [id number] Date of Birth: 01-15-1976 Age: 47 y.o. Gender: male  Primary Care Provider: Patient, No Pcp Per Consultants: None Code Status: FULL Preferred Emergency Contact: Yahmir Gish [phone]  Chief Complaint: Felt unwell, frequent urination and thirst  Assessment and Plan: Benjamin Phillips is a 47 y.o. male presenting with hyperglycemia . Differential for this patient's presentation of this includes HHS given patient is not acidotic on ABG, initial glucose over 700, osmolality of 321, BHB of 2.4.  DKA possible as well given patient has ketones in urine and an anion gap although BHB is less than 3 and VBG without any acidosis.  Do not believe patient has sepsis or underlying pulmonary infection.  Believe most likely trigger for this event was getting a viral respiratory illness prior to this week.  * Hyperosmolar hyperglycemic state (HHS) (Benjamin Phillips) New onset diabetes presenting with most likely HHS.  Trigger seems to be respiratory illness 2 weeks ago with dehydration. No respiratory symptoms currently. Presented with serum glucose 758, K 4.5, VBG 7.342, AG 18, UA with ketones and large glucose.  BHB 2.4.  S/p 1 L LR in ED.  -Admit to FPTS, attending Dr. Thompson Grayer, Progressive unit -Diabetes coordinator consulted, appreciate recommendations -Code status: FULL -IV location: PIV (left) -DVT ppx: Lovenox -NPO while on insulin drip -IVF: per protocol (Endotool) -LR infusion versus D5LR based on glucose -POC CBG checks per endotool -Trend BMP, Mg, 4 hours until stable -Electrolytes: Potassium: (>5.3, hold off on replacement. 3.3-5.3, give 63mqK with each L of IVF, <3.3, hold insulin & give 475m K until >3.3); replete Mg as indicated -PRN Zofran for nausea/vomiting -Once BG<300 -Once HHS resolves (glucose < 300 mg/dL) and the patient is able to eat, will  transition to subcutaneous insulin regimen -Continue drip until anion gap closed x 1 -A1c  Elevated blood pressure reading No known diagnosis of hypertension although he has been told he has had high blood pressure in the past.  Not on any medications at home for this.  Ranges [id number] over [id number] low 100s. -Monitor once off fluids -Consider outpatient follow-up/TOC for PCP needs  FEN/GI: NPO while on insulin VTE Prophylaxis: Lovenox  Disposition: Progressive  History of Present Illness:  Benjamin Phillips a [age].o. male presenting with hyperglycemia, polydupsia and polyuria  Had been feeling much worse than normal Urinating more at night time 6-7 times a night for the last week or so. A lot more thirsty and mouth has been very dry.  He says that he has been drinking a lot of apple juice and milk and mountain dew because he though he was hypoglycemic. He has never been told he has diabetes.  Today he was a little lightheaded and had a mild headache that has resolved with insulin drip. He drank 4 cans of of mountain dew before coming here because he thought he was hypoglycemic.  That prior to this week he had a viral respiratory illness that resolved on its own.  He denies any respiratory symptoms or now.  In the ED, had initial glucose of 758, anion gap of 18.  CBC unremarkable.  On 10/10/2019, VBG with pH of 7.342, BHB of 2.4 and negative respiratory panel.  Urinalysis with greater than 500 urine and 15 ketones.  Review Of Systems: Per HPI with the following additions: Denies any headaches, vision changes, chest pain, shortness of  breath, abdominal pain, leg swelling  Pertinent Past Medical History: None unsure if he had blood pressure Remainder reviewed in history tab.   Pertinent Past Surgical History: None  Remainder reviewed in history tab.   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   Important Outpatient Medications: Tylenol every hours Aspirin 1/2 a pill a day Remainder reviewed in medication history.   Objective: BP (!) 153/99   Pulse 81   Temp 99.2 F (37.3 C) (Oral)   Resp 15   Ht 6' 2"$  (1.88 m)   Wt 113.4 kg   SpO2 94%   BMI 32.10 kg/m  Exam: General: NAD, laying in bed comfortably, alert and oriented x 4, conversant Eyes: Pupils equal and symmetric ENTM: Mildly dry mucous membranes, no nasal congestion Neck: Range of motion intact Cardiovascular: Regular rate and rhythm, no murmurs or gallops Respiratory: Clear to auscultation bilaterally, no wheezes rales or crackles, no increased work of breathing on room air Gastrointestinal: Soft, nontender to palpation, bowel sounds normal active MSK: No lower extremity edema Derm: No rashes or wounds seen Neuro: No focal deficits, alert and responsive to all questions Psych: Mood appropriate, pleasant  Labs:  CBC BMET  Recent Labs  Lab 04/17/22 1257 04/17/22 1543  WBC 6.2  --   HGB 15.0 16.7  HCT 44.5 49.0  PLT 420*  --    Recent Labs  Lab 04/17/22 1257 04/17/22 1543  NA 125* 130*  K 4.5 5.1  CL 90*  --   CO2 17*  --   BUN 18  --   CREATININE 1.23  --   GLUCOSE 758*  --   CALCIUM 9.8  --     Pertinent additional labs BHB 2.4, pH 7.342, urinalysis greater than 500 glucose, 18 ketones.   Imaging Studies Performed:  None  Gerrit Heck, MD 04/17/2022, 7:05 PM PGY-2, [place] Intern pager: [phone], text pages welcome Secure chat group [place]

## 2022-04-17 NOTE — Assessment & Plan Note (Deleted)
New onset diabetes presenting with most likely HHS.  Trigger seems to be respiratory illness 2 weeks ago with dehydration. No respiratory symptoms currently. Presented with serum glucose 758, K 4.5, VBG 7.342, AG 18, UA with ketones and large glucose.  BHB 2.4.  S/p 1 L LR in ED.  -Admit to FPTS, attending Dr. Thompson Grayer, Progressive unit -Diabetes coordinator consulted, appreciate recommendations -Code status: FULL -IV location: PIV (left) -DVT ppx: Lovenox -NPO while on insulin drip -IVF: per protocol (Endotool) -LR infusion versus D5LR based on glucose -POC CBG checks per endotool -Trend BMP, Mg, 4 hours until stable -Electrolytes: Potassium: (>5.3, hold off on replacement. 3.3-5.3, give 69mqK with each L of IVF, <3.3, hold insulin & give 43m K until >3.3); replete Mg as indicated -PRN Zofran for nausea/vomiting -Once BG<300 -Once HHS resolves (glucose < 300 mg/dL) and the patient is able to eat, will transition to subcutaneous insulin regimen -Continue drip until anion gap closed x 1 -When transitioning to subcutaneous continue insulin drip for addition 1-2 hours before stopping -A1c

## 2022-04-17 NOTE — ED Notes (Signed)
ED TO INPATIENT HANDOFF REPORT  ED Nurse Name and Phone #: Iona Coach Name/Age/Gender Benjamin Phillips 47 y.o. male Room/Bed: 006C/006C  Code Status   Code Status: Not on file  Home/SNF/Other Home Patient oriented to: self, place, time, and situation Is this baseline? Yes   Triage Complete: Triage complete  Chief Complaint Hyperosmolar hyperglycemic state (HHS) ([place]) [E11.00]  Triage Note Pt reports urinary frequency, dry mouth, and headache x 1 week. Pt denies N/V.    Allergies No Known Allergies  Level of Care/Admitting Diagnosis ED Disposition     ED Disposition  Admit   Condition  --   Comment  Hospital Area: [facility] [[id number]]  Level of Care: Progressive [102]  Admit to Progressive based on following criteria: GI, ENDOCRINE disease patients with GI bleeding, acute liver failure or pancreatitis, stable with diabetic ketoacidosis or thyrotoxicosis (hypothyroid) state.  May place patient in observation at [facility] or [place] if equivalent level of care is available:: No  Covid Evaluation: Asymptomatic - no recent exposure (last 10 days) testing not required  Diagnosis: Hyperosmolar hyperglycemic state (HHS) [facility]) [id number]  Admitting Physician: Gerrit Heck [id number]  Attending Physician: Lenoria Chime [id number]          B Medical/Surgery History Past Medical History:  Diagnosis Date   Hypertension    History reviewed. No pertinent surgical history.   A IV Location/Drains/Wounds Patient Lines/Drains/Airways Status     Active Line/Drains/Airways     Name Placement date Placement time Site Days   Peripheral IV 04/17/22 20 G Left Antecubital 04/17/22  1301  Antecubital  less than 1            Intake/Output Last 24 hours  Intake/Output Summary (Last 24 hours) at 04/17/2022 1820 Last data filed at 04/17/2022 1735 Gross per 24 hour  Intake 2000 ml  Output --  Net 2000 ml    Labs/Imaging Results for orders  placed or performed during the hospital encounter of 04/17/22 (from the past 48 hour(s))  CBC with Differential     Status: Abnormal   Collection Time: 04/17/22 12:57 PM  Result Value Ref Range   WBC 6.2 4.0 - 10.5 K/uL   RBC 5.11 4.22 - 5.81 MIL/uL   Hemoglobin 15.0 13.0 - 17.0 g/dL   HCT 44.5 39.0 - 52.0 %   MCV 87.1 80.0 - 100.0 fL   MCH 29.4 26.0 - 34.0 pg   MCHC 33.7 30.0 - 36.0 g/dL   RDW 12.6 11.5 - 15.5 %   Platelets 420 (H) 150 - 400 K/uL   nRBC 0.0 0.0 - 0.2 %   Neutrophils Relative % 55 %   Neutro Abs 3.4 1.7 - 7.7 K/uL   Lymphocytes Relative 36 %   Lymphs Abs 2.2 0.7 - 4.0 K/uL   Monocytes Relative 7 %   Monocytes Absolute 0.4 0.1 - 1.0 K/uL   Eosinophils Relative 1 %   Eosinophils Absolute 0.1 0.0 - 0.5 K/uL   Basophils Relative 1 %   Basophils Absolute 0.0 0.0 - 0.1 K/uL   Immature Granulocytes 0 %   Abs Immature Granulocytes 0.02 0.00 - 0.07 K/uL    Comment: Performed at [facility], [address] [place], [place] [address]  Comprehensive metabolic panel     Status: Abnormal   Collection Time: 04/17/22 12:57 PM  Result Value Ref Range   Sodium 125 (L) 135 - 145 mmol/L   Potassium 4.5 3.5 - 5.1 mmol/L  Chloride 90 (L) 98 - 111 mmol/L   CO2 17 (L) 22 - 32 mmol/L   Glucose, Bld 758 (HH) 70 - 99 mg/dL    Comment: CRITICAL RESULT CALLED TO, READ BACK BY AND VERIFIED WITH Shon Hale, RN [phone] 04/17/22 L. KLAR Glucose reference range applies only to samples taken after fasting for at least 8 hours.    BUN 18 6 - 20 mg/dL   Creatinine, Ser 1.23 0.61 - 1.24 mg/dL   Calcium 9.8 8.9 - 10.3 mg/dL   Total Protein 8.3 (H) 6.5 - 8.1 g/dL   Albumin 4.4 3.5 - 5.0 g/dL   AST 26 15 - 41 U/L   ALT 33 0 - 44 U/L   Alkaline Phosphatase 122 38 - 126 U/L   Total Bilirubin 0.9 0.3 - 1.2 mg/dL   GFR, Estimated >60 >60 mL/min    Comment: (NOTE) Calculated using the CKD-EPI Creatinine Equation (2021)    Anion gap 18 (H) 5 - 15    Comment: Performed at [facility], [place] [address] [place], [place] [address]  Urinalysis, Routine w reflex microscopic -Urine, Clean Catch     Status: Abnormal   Collection Time: 04/17/22  1:34 PM  Result Value Ref Range   Color, Urine YELLOW YELLOW   APPearance CLEAR CLEAR   Specific Gravity, Urine <1.005 (L) 1.005 - 1.030   pH 5.5 5.0 - 8.0   Glucose, UA >=500 (A) NEGATIVE mg/dL   Hgb urine dipstick TRACE (A) NEGATIVE   Bilirubin Urine NEGATIVE NEGATIVE   Ketones, ur 15 (A) NEGATIVE mg/dL   Protein, ur NEGATIVE NEGATIVE mg/dL   Nitrite NEGATIVE NEGATIVE   Leukocytes,Ua NEGATIVE NEGATIVE    Comment: Performed at [facility] [address], [place], [place] [address]  Urinalysis, Microscopic (reflex)     Status: Abnormal   Collection Time: 04/17/22  1:34 PM  Result Value Ref Range   RBC / HPF 0-5 0 - 5 RBC/hpf   WBC, UA NONE SEEN 0 - 5 WBC/hpf   Bacteria, UA RARE (A) NONE SEEN   Squamous Epithelial / HPF NONE SEEN 0 - 5 /HPF    Comment: Performed at [facility], [place] [address] [place], [place] [address]  Resp panel by RT-PCR (RSV, Flu A&B, Covid) Anterior Nasal Swab     Status: None   Collection Time: 04/17/22  1:35 PM   Specimen: Anterior Nasal Swab  Result Value Ref Range   SARS Coronavirus 2 by RT PCR NEGATIVE NEGATIVE   Influenza A by PCR NEGATIVE NEGATIVE   Influenza B by PCR NEGATIVE NEGATIVE    Comment: (NOTE) The Xpert Xpress SARS-CoV-2/FLU/RSV plus assay is intended as an aid in the diagnosis of influenza from Nasopharyngeal swab specimens and should not be used as a sole basis for treatment. Nasal washings and aspirates are unacceptable for Xpert Xpress SARS-CoV-2/FLU/RSV testing.  Fact Sheet for Patients: EntrepreneurPulse.com.au  Fact Sheet for Healthcare Providers: IncredibleEmployment.be  This test is not yet approved or cleared by the [place] FDA and has been authorized for detection and/or diagnosis of SARS-CoV-2 by FDA  under an Emergency Use Authorization (EUA). This EUA will remain in effect (meaning this test can be used) for the duration of the COVID-19 declaration under Section 564(b)(1) of the Act, 21 U.S.C. section 360bbb-3(b)(1), unless the authorization is terminated or revoked.     Resp Syncytial Virus by PCR NEGATIVE NEGATIVE    Comment: (NOTE) Fact Sheet for Patients: EntrepreneurPulse.com.au  Fact Sheet for Healthcare Providers: IncredibleEmployment.be  This test is not yet approved or cleared by the [place] FDA and has been authorized for detection and/or diagnosis of SARS-CoV-2 by FDA under an Emergency Use Authorization (EUA). This EUA will remain in effect (meaning this test can be used) for the duration of the COVID-19 declaration under Section 564(b)(1) of the Act, 21 U.S.C. section 360bbb-3(b)(1), unless the authorization is terminated or revoked.  Performed at [facility], [place] [address], [place], [place] [address]   POC CBG, ED     Status: Abnormal   Collection Time: 04/17/22  2:04 PM  Result Value Ref Range   Glucose-Capillary >600 (HH) 70 - 99 mg/dL    Comment: Glucose reference range applies only to samples taken after fasting for at least 8 hours.  Beta-hydroxybutyric acid     Status: Abnormal   Collection Time: 04/17/22  3:00 PM  Result Value Ref Range   Beta-Hydroxybutyric Acid 2.40 (H) 0.05 - 0.27 mmol/L    Comment: Performed at [facility] [address], [place], [place] [address]  Osmolality     Status: Abnormal   Collection Time: 04/17/22  3:00 PM  Result Value Ref Range   Osmolality 321 (HH) 275 - 295 mOsm/kg    Comment: REPEATED TO VERIFY CRITICAL RESULT CALLED TO, READ BACK BY AND VERIFIED WITH: Critical called to Bonna Gains, RN @ 1700 04/17/2022 by S.Stanley Performed at [facility], [address] [place], [place] [address]   I-Stat venous blood gas, ED     Status: Abnormal    Collection Time: 04/17/22  3:43 PM  Result Value Ref Range   pH, Ven 7.342 7.25 - 7.43   pCO2, Ven 47.1 44 - 60 mmHg   pO2, Ven 24 (LL) 32 - 45 mmHg   Bicarbonate 25.6 20.0 - 28.0 mmol/L   TCO2 27 22 - 32 mmol/L   O2 Saturation 39 %   Acid-base deficit 1.0 0.0 - 2.0 mmol/L   Sodium 130 (L) 135 - 145 mmol/L   Potassium 5.1 3.5 - 5.1 mmol/L   Calcium, Ion 1.22 1.15 - 1.40 mmol/L   HCT 49.0 39.0 - 52.0 %   Hemoglobin 16.7 13.0 - 17.0 g/dL   Sample type VENOUS    Comment NOTIFIED PHYSICIAN   CBG monitoring, ED     Status: Abnormal   Collection Time: 04/17/22  3:52 PM  Result Value Ref Range   Glucose-Capillary 588 (HH) 70 - 99 mg/dL    Comment: Glucose reference range applies only to samples taken after fasting for at least 8 hours.   Comment 1 Document in Chart   CBG monitoring, ED     Status: Abnormal   Collection Time: 04/17/22  4:34 PM  Result Value Ref Range   Glucose-Capillary 484 (H) 70 - 99 mg/dL    Comment: Glucose reference range applies only to samples taken after fasting for at least 8 hours.  CBG monitoring, ED     Status: Abnormal   Collection Time: 04/17/22  5:33 PM  Result Value Ref Range   Glucose-Capillary 372 (H) 70 - 99 mg/dL    Comment: Glucose reference range applies only to samples taken after fasting for at least 8 hours.   No results found.  Pending Labs Unresulted Labs (From admission, onward)    None       Vitals/Pain Today's Vitals   04/17/22 1700 04/17/22 1715 04/17/22 1730 04/17/22 1745  BP: (!) 160/105 Marland Kitchen)  178/84 (!) 151/98 (!) 152/102  Pulse: 70 76 72 81  Resp: 13 (!) 31 18 13  $ Temp:      TempSrc:      SpO2: 97% 93% 95% 96%  Weight:      Height:      PainSc:        Isolation Precautions No active isolations  Medications Medications  insulin regular, human (MYXREDLIN) 100 units/ 100 mL infusion (4.8 Units/hr Intravenous Rate/Dose Change 04/17/22 1743)  lactated ringers infusion ( Intravenous New Bag/Given 04/17/22 1554)  dextrose  5 % in lactated ringers infusion (has no administration in time range)  dextrose 50 % solution 0-50 mL (has no administration in time range)  potassium chloride 10 mEq in 100 mL IVPB (10 mEq Intravenous New Bag/Given 04/17/22 1750)  sodium chloride 0.9 % bolus 1,000 mL (0 mLs Intravenous Stopped 04/17/22 1734)  lactated ringers bolus (0 mLs Intravenous Stopped 04/17/22 1735)    Mobility walks     Focused Assessments     R Recommendations: See Admitting Provider Note  Report given to:   Additional Notes:

## 2022-04-17 NOTE — ED Provider Notes (Signed)
[place] Provider Note   CSN: [id number] Arrival date & time: 04/17/22  1212     History  Chief Complaint  Patient presents with   Urinary Frequency    Benjamin Phillips is a 47 y.o. male.  Patient with history of hypertension presents today with complaints of polydipsia and polyuria.  He states that same has been ongoing for the past 1 week.  He states that a few weeks ago he had some sort of upper respiratory infection which has since resolved, however since then he has been feeling dehydrated and feels like he is urinating every hour to the point that he is not sleeping because he has to constantly get up to go to the bathroom.  He denies any known history of diabetes.  Denies any nausea, vomiting, or abdominal pain.  Denies any hematuria or dysuria.  The history is provided by the patient. No language interpreter was used.  Urinary Frequency       Home Medications Prior to Admission medications   Medication Sig Start Date End Date Taking? Authorizing Provider  Aspirin-Salicylamide-Caffeine (BC HEADACHE POWDER PO) Take 1 packet by mouth 2 (two) times daily as needed (headache).    [provider]  metoCLOPramide (REGLAN) 10 MG tablet Take 1 tablet (10 mg total) by mouth every 8 (eight) hours as needed (headaches). 05/25/15   Margaretann Loveless, MD      Allergies    Patient has no known allergies.    Review of Systems   Review of Systems  Endocrine: Positive for polydipsia and polyuria.  All other systems reviewed and are negative.   Physical Exam Updated Vital Signs BP (!) 165/119   Pulse 90   Temp 98.1 F (36.7 C) (Oral)   Resp 16   SpO2 94%  Physical Exam Vitals and nursing note reviewed.  Constitutional:      General: He is not in acute distress.    Appearance: Normal appearance. He is normal weight. He is not ill-appearing, toxic-appearing or diaphoretic.  HENT:     Head: Normocephalic and atraumatic.   Eyes:     Pupils: Pupils are equal, round, and reactive to light.  Cardiovascular:     Rate and Rhythm: Normal rate.  Pulmonary:     Effort: Pulmonary effort is normal. No respiratory distress.  Abdominal:     General: Abdomen is flat.     Palpations: Abdomen is soft.  Musculoskeletal:        General: Normal range of motion.     Cervical back: Normal range of motion.  Skin:    General: Skin is warm and dry.  Neurological:     General: No focal deficit present.     Mental Status: He is alert.  Psychiatric:        Mood and Affect: Mood normal.        Behavior: Behavior normal.     ED Results / Procedures / Treatments   Labs (all labs ordered are listed, but only abnormal results are displayed) Labs Reviewed  CBC WITH DIFFERENTIAL/PLATELET - Abnormal; Notable for the following components:      Result Value   Platelets 420 (*)    All other components within normal limits  COMPREHENSIVE METABOLIC PANEL - Abnormal; Notable for the following components:   Sodium 125 (*)    Chloride 90 (*)    CO2 17 (*)    Glucose, Bld 758 (*)    Total Protein 8.3 (*)  Anion gap 18 (*)    All other components within normal limits  URINALYSIS, ROUTINE W REFLEX MICROSCOPIC - Abnormal; Notable for the following components:   Specific Gravity, Urine <1.005 (*)    Glucose, UA >=500 (*)    Hgb urine dipstick TRACE (*)    Ketones, ur 15 (*)    All other components within normal limits  BETA-HYDROXYBUTYRIC ACID - Abnormal; Notable for the following components:   Beta-Hydroxybutyric Acid 2.40 (*)    All other components within normal limits  URINALYSIS, MICROSCOPIC (REFLEX) - Abnormal; Notable for the following components:   Bacteria, UA RARE (*)    All other components within normal limits  CBG MONITORING, ED - Abnormal; Notable for the following components:   Glucose-Capillary >600 (*)    All other components within normal limits  I-STAT VENOUS BLOOD GAS, ED - Abnormal; Notable for the  following components:   pO2, Ven 24 (*)    Sodium 130 (*)    All other components within normal limits  CBG MONITORING, ED - Abnormal; Notable for the following components:   Glucose-Capillary 588 (*)    All other components within normal limits  CBG MONITORING, ED - Abnormal; Notable for the following components:   Glucose-Capillary 484 (*)    All other components within normal limits  RESP PANEL BY RT-PCR (RSV, FLU A&B, COVID)  RVPGX2  OSMOLALITY    EKG None  Radiology No results found.  Procedures .Critical Care  Performed by: Bud Face, PA-C Authorized by: Bud Face, PA-C   Critical care provider statement:    Critical care time (minutes):  45   Critical care start time:  04/17/2022 3:00 PM   Critical care end time:  04/17/2022 3:45 PM   Critical care was necessary to treat or prevent imminent or life-threatening deterioration of the following conditions:  Metabolic crisis and dehydration (New diabetes/HHS)   Critical care was time spent personally by me on the following activities:  Development of treatment plan with patient or surrogate, discussions with consultants, discussions with primary provider, evaluation of patient's response to treatment, examination of patient, obtaining history from patient or surrogate, ordering and review of laboratory studies, pulse oximetry and re-evaluation of patient's condition   Care discussed with: admitting provider       Medications Ordered in ED Medications  insulin regular, human (MYXREDLIN) 100 units/ 100 mL infusion (4.8 Units/hr Intravenous Rate/Dose Change 04/17/22 1743)  lactated ringers infusion ( Intravenous New Bag/Given 04/17/22 1554)  dextrose 5 % in lactated ringers infusion (has no administration in time range)  dextrose 50 % solution 0-50 mL (has no administration in time range)  potassium chloride 10 mEq in 100 mL IVPB (10 mEq Intravenous New Bag/Given 04/17/22 1555)  sodium chloride 0.9 % bolus 1,000 mL (0 mLs  Intravenous Stopped 04/17/22 1734)  lactated ringers bolus (0 mLs Intravenous Stopped 04/17/22 1735)    ED Course/ Medical Decision Making/ A&P Clinical Course as of 04/17/22 1735  Fri Apr 17, 2022  1734 pH, Ven: 7.342 [VB]    Clinical Course User Index [VB] Elgie Congo, MD                             Medical Decision Making Amount and/or Complexity of Data Reviewed Labs: ordered.  Risk Prescription drug management.   This patient is a 47 y.o. male who presents to the ED for concern of polydipsia, polyuria, this involves an  extensive number of treatment options, and is a complaint that carries with it a high risk of complications and morbidity. The emergent differential diagnosis prior to evaluation includes, but is not limited to,  DKA/HHS, hyperglycemia, sepsis. This is not an exhaustive differential.   Past Medical History / Co-morbidities / Social History: Hx hypertension  Physical Exam: Physical exam performed. The pertinent findings include: no acute physical exam findings  Lab Tests: I ordered, and personally interpreted labs.  The pertinent results include:  CBG >600 --> 588 --> 484 --> 372, Na 125, chloride 90, bicarb 17, glucose 758, anion gap 18. UA with glucose and ketones. Bhb 2.4. Osmolality 321   Medications: I ordered medication including fluids, potassium, insulin  for HHS. Reevaluation of the patient after these medicines showed that the patient improved. I have reviewed the patients home medicines and have made adjustments as needed.   Disposition:  Patient presents today with complaints of polyuria and polydipsia x 1 week.  He is afebrile, nontoxic-appearing, and in no acute distress with reassuring vital signs.  Found to have a blood glucose of greater than 600.  Symptoms likely due to same.  Patient has no history of diabetes.  Will require admission.  Patient is understanding and amenable with plan.  Discussed patient with hospitalist who agrees to  admit.  Findings and plan of care discussed with supervising physician Dr. Regenia Skeeter who is in agreement.    Final Clinical Impression(s) / ED Diagnoses Final diagnoses:  Newly diagnosed diabetes ([place])  Hyperosmolar hyperglycemic state (HHS) [facility])    Rx / DC Orders ED Discharge Orders     None         Nestor Lewandowsky 04/17/22 1756    Sherwood Gambler, MD 04/19/22 1524

## 2022-04-17 NOTE — Progress Notes (Signed)
FMTS Brief Progress Note  S:Patient feeling much better compared to admission. Denies polyuria, polydipsia. Denies headaches or changes in vision. Denies nausea / vomiting or GI upset. Denies abdominal pain. Denies chest pain, SOB. He asked for food and to get his immunizations tomorrow.  O: BP (!) 172/113 (BP Location: Right Arm)   Pulse 84   Temp 98.6 F (37 C) (Oral)   Resp 16   Ht 6' 2"$  (1.88 m)   Wt 113.4 kg   SpO2 93%   BMI 32.10 kg/m   General: well-appearing and in no acute distress HEENT: normocephalic and atraumatic Respiratory: CTAB anteriorly, normal respiratory effort, and on RA Extremities: moving all extremities spontaneously Gastrointestinal: non-tender and non-distended Cardiovascular: regular rate  A/P: - Orders reviewed. Labs for AM ordered, which was adjusted as needed.  - stopped insulin drip. 10 units semglee ordered  Camelia Phenes, MD 04/17/2022, 10:48 PM PGY-1, [place] Night Resident  Please page [phone] with questions.

## 2022-04-17 NOTE — Assessment & Plan Note (Addendum)
Persistently elevated to 140s systolic. Started on Losartan 25 mg yesterday.  -Continue Losartan 25 mg daily  -Will need to f/u with PCP for lab monitoring and adjustments as needed

## 2022-04-17 NOTE — ED Triage Notes (Signed)
Pt reports urinary frequency, dry mouth, and headache x 1 week. Pt denies N/V.

## 2022-04-17 NOTE — ED Notes (Signed)
I-stat venous blood gas result given to Dr. Regenia Skeeter

## 2022-04-18 DIAGNOSIS — E119 Type 2 diabetes mellitus without complications: Secondary | ICD-10-CM

## 2022-04-18 DIAGNOSIS — E1100 Type 2 diabetes mellitus with hyperosmolarity without nonketotic hyperglycemic-hyperosmolar coma (NKHHC): Secondary | ICD-10-CM | POA: Diagnosis not present

## 2022-04-18 LAB — GLUCOSE, CAPILLARY
Glucose-Capillary: 413 mg/dL — ABNORMAL HIGH (ref 70–99)
Glucose-Capillary: 305 mg/dL — ABNORMAL HIGH (ref 70–99)
Glucose-Capillary: 332 mg/dL — ABNORMAL HIGH (ref 70–99)
Glucose-Capillary: 391 mg/dL — ABNORMAL HIGH (ref 70–99)
Glucose-Capillary: 449 mg/dL — ABNORMAL HIGH (ref 70–99)
Glucose-Capillary: 344 mg/dL — ABNORMAL HIGH (ref 70–99)

## 2022-04-18 LAB — BASIC METABOLIC PANEL
Anion gap: 10 (ref 5–15)
BUN: 12 mg/dL (ref 6–20)
CO2: 20 mmol/L — ABNORMAL LOW (ref 22–32)
Calcium: 8.9 mg/dL (ref 8.9–10.3)
Chloride: 100 mmol/L (ref 98–111)
Creatinine, Ser: 0.98 mg/dL (ref 0.61–1.24)
GFR, Estimated: >60 mL/min (ref >60)
Glucose, Bld: 334 mg/dL — ABNORMAL HIGH (ref 70–99)
Potassium: 4 mmol/L (ref 3.5–5.1)
Sodium: 130 mmol/L — ABNORMAL LOW (ref 135–145)

## 2022-04-18 LAB — CBC
HCT: 40.6 % (ref 39.0–52.0)
Hemoglobin: 13.8 g/dL (ref 13.0–17.0)
MCH: 29.2 pg (ref 26.0–34.0)
MCHC: 34 g/dL (ref 30.0–36.0)
MCV: 85.8 fL (ref 80.0–100.0)
Platelets: 380 10*3/uL (ref 150–400)
RBC: 4.73 MIL/uL (ref 4.22–5.81)
RDW: 12.6 % (ref 11.5–15.5)
WBC: 7.6 10*3/uL (ref 4.0–10.5)
nRBC: 0 % (ref 0.0–0.2)

## 2022-04-18 LAB — HEMOGLOBIN A1C
Hgb A1c MFr Bld: 11.8 % — ABNORMAL HIGH (ref 4.8–5.6)
Mean Plasma Glucose: 291.96 mg/dL

## 2022-04-18 LAB — MAGNESIUM: Magnesium: 2 mg/dL (ref 1.7–2.4)

## 2022-04-18 MED ORDER — INSULIN STARTER KIT- PEN NEEDLES (ENGLISH)
1.0000 | Freq: Once | Status: AC
  Administered 2022-04-18: 1
  Filled 2022-04-18: qty 1

## 2022-04-18 MED ORDER — INSULIN ASPART 100 UNIT/ML IJ SOLN
5.0000 [IU] | Freq: Once | INTRAMUSCULAR | Status: AC
  Administered 2022-04-18: 5 [IU] via SUBCUTANEOUS

## 2022-04-18 MED ORDER — LIVING WELL WITH DIABETES BOOK
Freq: Once | Status: AC
  Filled 2022-04-18: qty 1

## 2022-04-18 MED ORDER — INSULIN ASPART 100 UNIT/ML IJ SOLN
6.0000 [IU] | Freq: Once | INTRAMUSCULAR | Status: AC
  Administered 2022-04-18: 6 [IU] via SUBCUTANEOUS

## 2022-04-18 MED ORDER — LOSARTAN POTASSIUM 50 MG PO TABS
25.0000 mg | ORAL_TABLET | Freq: Every day | ORAL | Status: DC
  Administered 2022-04-18 – 2022-04-19 (×2): 25 mg via ORAL
  Filled 2022-04-18 (×2): qty 1

## 2022-04-18 MED ORDER — INSULIN GLARGINE-YFGN 100 UNIT/ML ~~LOC~~ SOLN
20.0000 [IU] | Freq: Every day | SUBCUTANEOUS | Status: DC
  Administered 2022-04-18: 20 [IU] via SUBCUTANEOUS
  Filled 2022-04-18: qty 0.2

## 2022-04-18 MED ORDER — INSULIN ASPART 100 UNIT/ML IJ SOLN: 0.0000 [IU] | Freq: Three times a day (TID) | INTRAMUSCULAR | Status: DC

## 2022-04-18 MED ORDER — INSULIN ASPART 100 UNIT/ML IJ SOLN
0.0000 [IU] | Freq: Every day | INTRAMUSCULAR | Status: DC
  Administered 2022-04-18: 5 [IU] via SUBCUTANEOUS

## 2022-04-18 NOTE — Inpatient Diabetes Management (Addendum)
Inpatient Diabetes Program Recommendations  AACE/ADA: New Consensus Statement on Inpatient Glycemic Control (2015)  Target Ranges:  Prepandial:   less than 140 mg/dL      Peak postprandial:   less than 180 mg/dL (1-2 hours)      Critically ill patients:  140 - 180 mg/dL   Lab Results  Component Value Date   GLUCAP 332 (H) 04/18/2022   HGBA1C 11.8 (H) 04/18/2022    Review of Glycemic Control  Latest Reference Range & Units 04/17/22 19:55 04/17/22 22:12 04/18/22 01:28 04/18/22 08:04 04/18/22 12:18  Glucose-Capillary 70 - 99 mg/dL 325 (H) 247 (H) 413 (H) 305 (H) 332 (H)   Diabetes history: DM 2- Outpatient Diabetes medications:  None Current orders for Inpatient glycemic control:  Novolog 0-9 units tid with meals  Semglee 10 units daily  Inpatient Diabetes Program Recommendations:    Spoke with patient by phone regarding new diagnosis of DM. Discussed A1C results with him and explained what an A1C is, basic pathophysiology of DM Type 2, basic home care, importance of checking CBGs and maintaining good CBG control to prevent long-term and short-term complications.  Reviewed signs and symptoms of hyperglycemia and hypoglycemia.  RNs to provide ongoing basic DM education at bedside with this patient.  Have ordered educational booklet, insulin starter kit, and DM videos.  Patient states that his mom and brother both have diabetes.  He is agreeable to going home on insulin and oral agents for diabetes.  We reviewed normal blood sugar values and importance of close follow-up. Also briefly reviewed dietary modifications such as eliminating sugar from beverages, the plate method and reduction in CHO intake.  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  Encourage baking/grilling instead of frying.  Ordered living well with DM booklet and insulin starter kit.  Will ask RN to teach patient insulin administration, how to check blood sugars and other basic DM  information.  Thanks,  .js

## 2022-04-18 NOTE — Plan of Care (Signed)

## 2022-04-18 NOTE — Hospital Course (Addendum)
Benjamin Phillips is a 47 y.o. male who was admitted for new-onset type 2 diabetes and HHS. His hospital course is listed below by problem. Please refer to the H&P for additional information.  HHS, new dx T2DM  Presented with serum glucose 758, VBG without acidosis, AG 18. Started on Endotool and transitioned to subcutaneous insulin when AG closed. Hgb A1c returned 11.8. Diabetes coordinator was consulted for diabetes teaching. Insulin titration in the hospital, was on basal/bolus insulin. Discharged on *** given financial barrier to discharging with Semglee.   HTN Without dx of HTN prior to admission but with persistently elevated BP. Was started on 25 mg  Losartan.   Issues for PCP Follow Up Titrate insulin as appropriate. D/c with 25U Lantus Repeat A1c in May Recommend urine microalbumin for renal function, ophthalmology examination, foot examination  Recommend checking lipid panel  Up-titrate Metformin outpatient; discharged on 500 mg  BMP in 1 week as he started on Losartan while in the hospital

## 2022-04-18 NOTE — Progress Notes (Signed)
     Daily Progress Note Intern Pager: [phone]  Patient name: Benjamin Phillips Medical record number: [id number] Date of birth: 12/04/1975 Age: 47 y.o. Gender: male  Primary Care Provider: Patient, No Pcp Per Consultants: Diabetes educator  Code Status: FULL CODE   Pt Overview and Major Events to Date:  2/9: Admitted, started on endotool for HHS  Assessment and Plan: Benjamin Phillips is a 47 y.o. male who previously did not have any significant PMHx who was admitted for HHS and new type 2 diabetes diagnosis.  Newly diagnosed diabetes ([place]) HHS resolved. Appreciate assistance from diabetes coordinator with diabetes education. Pt feels comfortable with insulin. Will d/c today with 25U Lantus and start Metformin. -D/c 25U Lantus  -Start Metformin XR 500 mg daily  -Continued diabetes education outpatient  Elevated blood pressure reading Persistently elevated to 371I systolic. Started on Losartan 25 mg yesterday.  -Continue Losartan 25 mg daily  -Will need to f/u with PCP for lab monitoring and adjustments as needed   FEN/GI: Carb modified  PPx: Lovenox  Dispo:today.   Subjective:  Benjamin Phillips feels well this morning.  States that he was able to give himself insulin yesterday, felt comfortable with this. Denies any abdominal pain. Does not have any additional questions. He feels comfortable discharging home today.   Objective: Temp:  [97.8 F (36.6 C)-98.2 F (36.8 C)] 98.2 F (36.8 C) (02/10 2331) Pulse Rate:  [72-78] 78 (02/10 1200) Resp:  [16-25] 18 (02/10 2331) BP: (141-146)/(74-91) 142/91 (02/10 2331) SpO2:  [97 %-100 %] 97 % (02/10 2331) Physical Exam: General: NAD, pleasant Cardiovascular: RRR, no murmur Respiratory: CTAB, normal effort Abdomen: Soft, non-ttp in all quadrants   Laboratory: Most recent CBC Lab Results  Component Value Date   WBC 7.6 04/18/2022   HGB 13.8 04/18/2022   HCT 40.6 04/18/2022   MCV 85.8 04/18/2022   PLT 380 04/18/2022    Most recent BMP    Latest Ref Rng & Units 04/18/2022    3:40 AM  BMP  Glucose 70 - 99 mg/dL 334   BUN 6 - 20 mg/dL 12   Creatinine 0.61 - 1.24 mg/dL 0.98   Sodium 135 - 145 mmol/L 130   Potassium 3.5 - 5.1 mmol/L 4.0   Chloride 98 - 111 mmol/L 100   CO2 22 - 32 mmol/L 20   Calcium 8.9 - 10.3 mg/dL 8.9    Other pertinent labs: None  Imaging/Diagnostic Tests: None new  Sharion Settler, DO 04/19/2022, 7:02 AM  PGY-3, [place] Intern pager: [phone], text pages welcome Secure chat group [place]

## 2022-04-18 NOTE — Progress Notes (Signed)
     Daily Progress Note Intern Pager: [phone]  Patient name: Benjamin Phillips Medical record number: [id number] Date of birth: 1975-06-11 Age: 47 y.o. Gender: male  Primary Care Provider: Patient, No Pcp Per Consultants: Diabetes coordinator Code Status: Full code  Pt Overview and Major Events to Date:  2/9: Admitted, started on insulin drip 2/10: Transitioned to subcu insulin  Assessment and Plan: Benjamin Phillips is a 47 y.o. male presenting with hyperglycemia . Differential and plan below.  * Hyperosmolar hyperglycemic state (HHS) ([place]) Anion gap closed, electrolytes normal, patient not acidotic, transitioned to subcu insulin.  A1C 11.8. CBG checks between 250-350, has received 19 units of fast acting insulin this morning, and received 10 units Semglee last night.  Patient will likely need more basal coverage.  Currently asymptomatic.  We will continue to adjust insulin regimen.  Patient needs PCP, TOC consulted. -Diabetes coordinator consulted, appreciate recommendations -Carb modified diet -POC CBG checks per 4 daily with meals -10 units Semglee (adjust this evening pending insulin needs today) -NovoLog with meals (sensitive scale) -PRN Zofran for nausea/vomiting  Elevated blood pressure reading Normotensive ranges, with slight elevation to systolic of 633 in room.  Will continue to monitor, consider starting blood pressure medication.  With diabetes recommend ACE/ARB coverage at discharge. -Continue to monitor - ACE/ARB if needed - Needs PCP appointment   FEN/GI: Carb modified PPx: Lovenox Dispo: Home pending clinical improvement and diabetes education  Subjective:  Patient reports he feels well today.  Patient inquired about the diagnosis of diabetes and its management.  Interviewer provided brief overview of diabetes and the medications used to treat.  Objective: Temp:  [97.4 F (36.3 C)-99.2 F (37.3 C)] 98.6 F (37 C) (02/09 1910) Pulse Rate:  [70-100]  72 (02/10 0800) Resp:  [12-31] 25 (02/10 0800) BP: (141-178)/(74-135) 141/74 (02/10 0800) SpO2:  [93 %-100 %] 100 % (02/10 0800) Weight:  [113.4 kg] 113.4 kg (02/09 1556) Physical Exam: General: NAD, pleasant Cardiovascular: RRR, no murmurs Respiratory: CTAB, normal breathing on room air  Laboratory: Most recent CBC Lab Results  Component Value Date   WBC 7.6 04/18/2022   HGB 13.8 04/18/2022   HCT 40.6 04/18/2022   MCV 85.8 04/18/2022   PLT 380 04/18/2022   Most recent BMP    Latest Ref Rng & Units 04/18/2022    3:40 AM  BMP  Glucose 70 - 99 mg/dL 334   BUN 6 - 20 mg/dL 12   Creatinine 0.61 - 1.24 mg/dL 0.98   Sodium 135 - 145 mmol/L 130   Potassium 3.5 - 5.1 mmol/L 4.0   Chloride 98 - 111 mmol/L 100   CO2 22 - 32 mmol/L 20   Calcium 8.9 - 10.3 mg/dL 8.9     Leslie Dales, DO 04/18/2022, 10:06 AM  PGY-1, [place] Intern pager: [phone], text pages welcome Secure chat group [place]

## 2022-04-19 ENCOUNTER — Other Ambulatory Visit: Payer: Self-pay | Admitting: Student

## 2022-04-19 DIAGNOSIS — R030 Elevated blood-pressure reading, without diagnosis of hypertension: Secondary | ICD-10-CM | POA: Diagnosis not present

## 2022-04-19 DIAGNOSIS — E119 Type 2 diabetes mellitus without complications: Secondary | ICD-10-CM | POA: Diagnosis not present

## 2022-04-19 LAB — GLUCOSE, CAPILLARY
Glucose-Capillary: 338 mg/dL — ABNORMAL HIGH (ref 70–99)
Glucose-Capillary: 310 mg/dL — ABNORMAL HIGH (ref 70–99)

## 2022-04-19 MED ORDER — LANCETS MISC. MISC: 1.0000 | Freq: Three times a day (TID) | 0 refills | Status: AC

## 2022-04-19 MED ORDER — INSULIN ASPART 100 UNIT/ML IJ SOLN
0.0000 [IU] | Freq: Three times a day (TID) | INTRAMUSCULAR | Status: DC
  Administered 2022-04-19 (×2): 11 [IU] via SUBCUTANEOUS

## 2022-04-19 MED ORDER — PEN NEEDLES 32G X 4 MM MISC: 1.0000 | Freq: Every day | 3 refills | Status: DC

## 2022-04-19 MED ORDER — BLOOD GLUCOSE TEST VI STRP: 1.0000 | ORAL_STRIP | Freq: Three times a day (TID) | 0 refills | Status: AC

## 2022-04-19 MED ORDER — INSULIN GLARGINE-YFGN 100 UNIT/ML ~~LOC~~ SOLN
25.0000 [IU] | Freq: Every day | SUBCUTANEOUS | Status: DC
  Administered 2022-04-19: 25 [IU] via SUBCUTANEOUS
  Filled 2022-04-19: qty 0.25

## 2022-04-19 MED ORDER — LOSARTAN POTASSIUM 25 MG PO TABS: 25.0000 mg | ORAL_TABLET | Freq: Every day | ORAL | 1 refills | Status: AC

## 2022-04-19 MED ORDER — LANCET DEVICE MISC: 1.0000 | Freq: Three times a day (TID) | 0 refills | Status: AC

## 2022-04-19 MED ORDER — INSULIN ASPART 100 UNIT/ML IJ SOLN: 0.0000 [IU] | Freq: Every day | INTRAMUSCULAR | Status: DC

## 2022-04-19 MED ORDER — INSULIN GLARGINE 100 UNIT/ML SOLOSTAR PEN: 25.0000 [IU] | PEN_INJECTOR | Freq: Every morning | SUBCUTANEOUS | 1 refills | Status: DC

## 2022-04-19 MED ORDER — INSULIN DETEMIR 100 UNIT/ML ~~LOC~~ SOLN: 25.0000 [IU] | Freq: Every day | SUBCUTANEOUS | 0 refills | Status: AC

## 2022-04-19 MED ORDER — METFORMIN HCL ER 500 MG PO TB24: 500.0000 mg | ORAL_TABLET | Freq: Every day | ORAL | 1 refills | Status: AC

## 2022-04-19 MED ORDER — BLOOD GLUCOSE MONITORING SUPPL DEVI: 1.0000 | Freq: Three times a day (TID) | 0 refills | Status: DC

## 2022-04-19 NOTE — Discharge Summary (Deleted)
[facility] Discharge Summary  Patient name: Benjamin Phillips Medical record number: [id number] Date of birth: [birth year] Age: 47 y.o. Gender: male Date of Admission: 04/17/2022  Date of Discharge: 04/19/22 Admitting Physician: Lenoria Chime, MD  Primary Care Provider: Patient, No Pcp Per Consultants: Diabetes Educator  Indication for Hospitalization: HHS, new dx uncontrolled T2DM   Brief Hospital Course:  SUKHJIT FLUCKIGER is a 47 y.o. male who was admitted for new-onset type 2 diabetes and HHS. His hospital course is listed below by problem. Please refer to the H&P for additional information.  HHS, new dx T2DM  Presented with serum glucose 758, VBG without acidosis, AG 18. Started on Endotool and transitioned to subcutaneous insulin when AG closed. Hgb A1c returned 11.8. Diabetes coordinator was consulted for diabetes teaching. Insulin titration in the hospital, was on basal/bolus insulin. Discharged on 25U of Lantus and started on 500 mg Metformin XR.   HTN Without dx of HTN prior to admission but with persistently elevated BP. Was started on 25 mg  Losartan.   Issues for PCP Follow Up Titrate insulin as appropriate. D/c with 25U Lantus Repeat A1c in May Recommend urine microalbumin for renal function, ophthalmology examination, foot examination  Recommend checking lipid panel  Up-titrate Metformin outpatient; discharged on 500 mg  BMP in 1 week as he started on Losartan while in the hospital    Discharge Diagnoses/Problem List:  Active Problems:   Elevated blood pressure reading   Newly diagnosed diabetes ([place]) Hyperosmotic Hyperglycemic State  Disposition: Home  Discharge Condition: Stable  Discharge Exam:  Blood pressure (!) 142/91, pulse 78, temperature 98.2 F (36.8 C), temperature source Oral, resp. rate 18, height 6' 2"$  (1.88 m), weight 113.4 kg, SpO2 97 %. Physical Exam: General: NAD, pleasant Cardiovascular: RRR, no  murmur Respiratory: CTAB, normal effort Abdomen: Soft, non-ttp in all quadrants   Significant Procedures: Placed on Endotool on admission due to elevated AG and significant hyperglycemia (>700)  Significant Labs and Imaging:  Recent Labs  Lab 04/17/22 1257 04/17/22 1543 04/18/22 0340  WBC 6.2  --  7.6  HGB 15.0 16.7 13.8  HCT 44.5 49.0 40.6  PLT 420*  --  380   Recent Labs  Lab 04/17/22 1257 04/17/22 1543 04/17/22 2011 04/17/22 2216 04/18/22 0340  NA 125* 130* 135  --  130*  K 4.5 5.1 4.0  --  4.0  CL 90*  --  100  --  100  CO2 17*  --  25  --  20*  GLUCOSE 758*  --  306*  --  334*  BUN 18  --  13  --  12  CREATININE 1.23  --  1.05  --  0.98  CALCIUM 9.8  --  9.6  --  8.9  MG  --   --  2.2 2.1 2.0  ALKPHOS 122  --   --   --   --   AST 26  --   --   --   --   ALT 33  --   --   --   --   ALBUMIN 4.4  --   --   --   --     Results/Tests Pending at Time of Discharge: None  Discharge Medications:  Allergies as of 04/19/2022   No Known Allergies      Medication List     STOP taking these medications    acetaminophen 650 MG CR tablet Commonly known as: TYLENOL  TAKE these medications    Blood Glucose Monitoring Suppl Devi 1 each by Does not apply route in the morning, at noon, and at bedtime. May substitute to any manufacturer covered by patient's insurance.   BLOOD GLUCOSE TEST STRIPS Strp 1 each by In Vitro route in the morning, at noon, and at bedtime. May substitute to any manufacturer covered by patient's insurance.   insulin glargine 100 UNIT/ML Solostar Pen Commonly known as: LANTUS Inject 25 Units into the skin every morning.   Lancet Device Misc 1 each by Does not apply route in the morning, at noon, and at bedtime. May substitute to any manufacturer covered by patient's insurance.   Lancets Misc. Misc 1 each by Does not apply route in the morning, at noon, and at bedtime. May substitute to any manufacturer covered by patient's  insurance.   losartan 25 MG tablet Commonly known as: COZAAR Take 1 tablet (25 mg total) by mouth daily.   metFORMIN 500 MG 24 hr tablet Commonly known as: GLUCOPHAGE-XR Take 1 tablet (500 mg total) by mouth daily with breakfast.       Discharge Instructions: Please refer to Patient Instructions section of EMR for full details.  Patient was counseled important signs and symptoms that should prompt return to medical care, changes in medications, dietary instructions, activity restrictions, and follow up appointments.   Follow-Up Appointments:   Sharion Settler, DO 04/19/2022, 7:07 AM PGY-3, [place]

## 2022-04-19 NOTE — TOC Transition Note (Signed)
Transition of Care [facility]) - CM/SW Discharge Note   Patient Details  Name: ONEL KONG MRN: [id number] Date of Birth: [birth year]  Transition of Care [facility]) CM/SW Contact:  Carles Collet, RN Phone Number: 04/19/2022, 12:24 PM   Clinical Narrative:     Re cost of meds for DC. Spoke w pharmacy- Lantus needs a PA, however Levemir does not- requested doc team to send script to pharmacy for Levemir or follow on PA for Lantus. Cost of Levemir is $80, discussed with patient Needles cost$132, however off the shelf needles are cheaper per pharmacist. Glucose machine and strips are $70, advised to get OTC machine and supplies from [facility], Rel[employer] brand, losartan and metformin copay costs are $0.         Patient Goals and CMS Choice      Discharge Placement                         Discharge Plan and Services Additional resources added to the After Visit Summary for                                       Social Determinants of Health (SDOH) Interventions SDOH Screenings   Food Insecurity: Food Insecurity Present (04/17/2022)  Housing: Medium Risk (04/17/2022)  Transportation Needs: No Transportation Needs (04/17/2022)  Utilities: Not At Risk (04/17/2022)  Tobacco Use: Low Risk  (04/17/2022)     Readmission Risk Interventions     No data to display

## 2022-04-19 NOTE — Progress Notes (Signed)
Inpatient Diabetes Program Recommendations  AACE/ADA: New Consensus Statement on Inpatient Glycemic Control (2015)  Target Ranges:  Prepandial:   less than 140 mg/dL      Peak postprandial:   less than 180 mg/dL (1-2 hours)      Critically ill patients:  140 - 180 mg/dL   Lab Results  Component Value Date   GLUCAP 310 (H) 04/19/2022   HGBA1C 11.8 (H) 04/18/2022    Review of Glycemic Control  Latest Reference Range & Units 04/18/22 15:35 04/18/22 21:15 04/18/22 23:28 04/19/22 07:48 04/19/22 11:23  Glucose-Capillary 70 - 99 mg/dL 391 (H) 449 (H) 344 (H) 338 (H) 310 (H)  Diabetes history: DM 2-New diagnosis Outpatient Diabetes medications:  None Current orders for Inpatient glycemic control:  Novolog 0-9 units tid with meals  Semglee 25 units daily   Inpatient Diabetes Program Recommendations:    Note cost could be issue with co-pay with insurance for Lantus>100$.  Discussed with resident and gave information regarding co-pay card for Lantus or Toujeo.  He will share with patient.  Needs close follow-up.  May also consider adding Metformin to regimen for insulin resistance.  Patient practiced insulin with RN yesterday.  Sent insulin pen video as well.  Note plans for discharge today.   Thanks,  Adah Perl, RN, BC-ADM Inpatient Diabetes Coordinator Pager [phone]  (8a-5p)

## 2022-04-19 NOTE — Discharge Instructions (Addendum)
Dear [name],  Thank you for letting us participate in your care. You were hospitalized for elevated sugars, found to have a new diagnosis of Type 2 Diabetes. You were treated with insulin to help bring your sugars down and met with the diabetes coordinator for education. Please use the co-pay card for lantus and sign up here for $35 Rx [email or web address]  POST-HOSPITAL & CARE INSTRUCTIONS Take 25 units of Lantus every morning  Take 500 mg (1 tablet) of your metformin daily We started you on a medication for your blood pressure called losartan.  Take 1 pill every day. We will need to check lab work about 1 week after your discharge to check your electrolytes and kidney function Go to your follow up appointments (listed below)   DOCTOR'S APPOINTMENT   Future Appointments  Date Time Provider [place]  04/23/2022  2:10 PM Sharion Settler, DO [facility] [place]     Take care and be well!  [facility]  [place], [place] [address] [phone]

## 2022-04-19 NOTE — Discharge Summary (Signed)
[facility] Discharge Summary  Patient name: Benjamin Phillips Medical record number: [id number] Date of birth: Mar 24, 1975 Age: 47 y.o. Gender: male Date of Admission: 04/17/2022  Date of Discharge: 04/19/22 Admitting Physician: Lenoria Chime, MD  Primary Care Provider: Patient, No Pcp Per Consultants: Diabetes coordinator   Indication for Hospitalization: HHS, new diagnosis T2DM  Brief Hospital Course:  Benjamin Phillips is a 47 y.o. male who was admitted for new-onset type 2 diabetes and HHS. His hospital course is listed below by problem. Please refer to the H&P for additional information.  HHS, new dx T2DM  Presented with serum glucose 758, VBG without acidosis, AG 18. Started on Endotool and transitioned to subcutaneous insulin when AG closed. Hgb A1c returned 11.8. Diabetes coordinator was consulted for diabetes teaching. Insulin titration in the hospital, was on basal/bolus insulin. Discharged on 25 U levemir given financial barrier to discharging with Semglee and Lantus required prior authorization.   HTN Without dx of HTN prior to admission but with persistently elevated BP. Was started on 25 mg  Losartan.   Issues for PCP Follow Up Titrate insulin as appropriate. D/c with 25U Lantus Repeat A1c in May Recommend urine microalbumin for renal function, ophthalmology examination, foot examination  Recommend checking lipid panel  Up-titrate Metformin outpatient; discharged on 500 mg  BMP in 1 week as he started on Losartan while in the hospital  Discharge Diagnoses/Problem List:  Active Problems:   Elevated blood pressure reading   Newly diagnosed diabetes ([place]) Hyperosmolar hyperglycemic State   Disposition: Home  Discharge Condition: Stable  Discharge Exam:  Blood pressure (!) 156/98, pulse 78, temperature 98.2 F (36.8 C), temperature source Oral, resp. rate 17, height 6' 2"$  (1.88 m), weight 113.4 kg, SpO2 97 %. Physical  Exam: General: NAD, pleasant Cardiovascular: RRR, no murmur Respiratory: CTAB, normal effort Abdomen: Soft, non-ttp in all quadrants     Significant Labs and Imaging:  Recent Labs  Lab 04/17/22 1257 04/17/22 1543 04/18/22 0340  WBC 6.2  --  7.6  HGB 15.0 16.7 13.8  HCT 44.5 49.0 40.6  PLT 420*  --  380   Recent Labs  Lab 04/17/22 1257 04/17/22 1543 04/17/22 2011 04/17/22 2216 04/18/22 0340  NA 125* 130* 135  --  130*  K 4.5 5.1 4.0  --  4.0  CL 90*  --  100  --  100  CO2 17*  --  25  --  20*  GLUCOSE 758*  --  306*  --  334*  BUN 18  --  13  --  12  CREATININE 1.23  --  1.05  --  0.98  CALCIUM 9.8  --  9.6  --  8.9  MG  --   --  2.2 2.1 2.0  ALKPHOS 122  --   --   --   --   AST 26  --   --   --   --   ALT 33  --   --   --   --   ALBUMIN 4.4  --   --   --   --     Results/Tests Pending at Time of Discharge: None  Discharge Medications:  Allergies as of 04/19/2022   No Known Allergies      Medication List     STOP taking these medications    acetaminophen 650 MG CR tablet Commonly known as: TYLENOL       TAKE these medications  Blood Glucose Monitoring Suppl Devi 1 each by Does not apply route in the morning, at noon, and at bedtime. May substitute to any manufacturer covered by patient's insurance.   BLOOD GLUCOSE TEST STRIPS Strp 1 each by In Vitro route in the morning, at noon, and at bedtime. May substitute to any manufacturer covered by patient's insurance.   insulin glargine 100 UNIT/ML Solostar Pen Commonly known as: LANTUS Inject 25 Units into the skin every morning.   Lancet Device Misc 1 each by Does not apply route in the morning, at noon, and at bedtime. May substitute to any manufacturer covered by patient's insurance.   Lancets Misc. Misc 1 each by Does not apply route in the morning, at noon, and at bedtime. May substitute to any manufacturer covered by patient's insurance.   losartan 25 MG tablet Commonly known as:  COZAAR Take 1 tablet (25 mg total) by mouth daily.   metFORMIN 500 MG 24 hr tablet Commonly known as: GLUCOPHAGE-XR Take 1 tablet (500 mg total) by mouth daily with breakfast.   Pen Needles 32G X 4 MM Misc 1 Needle by Does not apply route daily.        Discharge Instructions: Please refer to Patient Instructions section of EMR for full details.  Patient was counseled important signs and symptoms that should prompt return to medical care, changes in medications, dietary instructions, activity restrictions, and follow up appointments.   Follow-Up Appointments:   Sharion Settler, DO 04/19/2022, 8:28 AM PGY-3, [place]

## 2022-04-19 NOTE — Assessment & Plan Note (Addendum)
HHS resolved. Appreciate assistance from diabetes coordinator with diabetes education. Pt feels comfortable with insulin. Will d/c today with 25U Lantus and start Metformin. -D/c 25U Lantus  -Start Metformin XR 500 mg daily  -Continued diabetes education outpatient

## 2022-04-19 NOTE — Progress Notes (Signed)
Called [facility] pharmacy on [address] to inquire on cost of medications. Pharmacy tech reports that the [facility] is not covered and would cost him $106 out-of-pocket.  She is unable to tell me which insulin is on formulary.  The total cost of all other medications that are covered would be $205.61  This will likely pose barrier for patient. Will try to see if diabetes coordinator can provide sample insulin pen for patient prior to d/c and then we can follow up with additional samples in our clinic vs switching him to 70/30 vials.   Day team to follow up.

## 2022-04-20 LAB — GLUCOSE, CAPILLARY: Glucose-Capillary: 274 mg/dL — ABNORMAL HIGH (ref 70–99)

## 2022-04-22 NOTE — Progress Notes (Deleted)
    SUBJECTIVE:   CHIEF COMPLAINT / HPI:   Benjamin Phillips is a 47 y.o. male who presents to the [facility] clinic today to discuss the following concerns:   Hospital Follow Up Benjamin Phillips was recently hospitalized on 2/9 to 2/11 for HHS and a new diagnosis of type 2 diabetes. He was briefly started on Endotool until his anion gap closed. He was transitioned to subcutaneous insulin following. He received education by the diabetes coordinator while hospitalized. He was ultimately discharged on 25U of Levemir daily and started on Metformin XR 500 mg daily. In addition, he was started on Losartan 25 mg given persistently elevated blood pressures in the hospital.   Issues for PCP Follow Up Titrate insulin as appropriate. D/c with 25U Levemir Repeat A1c in May Recommend urine microalbumin for renal function, ophthalmology examination, foot examination  Recommend checking lipid panel  Up-titrate Metformin outpatient; discharged on 500 mg  BMP in 1 week as he started on Losartan while in the hospital  PERTINENT  PMH / PSH: None   OBJECTIVE:   There were no vitals taken for this visit.   General: NAD, pleasant, able to participate in exam Respiratory: normal effort Psych: Normal affect and mood  ASSESSMENT/PLAN:   No problem-specific Assessment & Plan notes found for this encounter.     Sharion Settler, [place]

## 2022-04-22 NOTE — Patient Instructions (Incomplete)
It was wonderful to see you today.  Please bring ALL of your medications with you to every visit.   Today we talked about:  **  Thank you for coming to your visit as scheduled. We have had a large "no-show" problem lately, and this significantly limits our ability to see and care for patients. As a friendly reminder- if you cannot make your appointment please call to cancel. We do have a no show policy for those who do not cancel within 24 hours. Our policy is that if you miss or fail to cancel an appointment within 24 hours, 3 times in a 6-month period, you may be dismissed from our clinic.   Thank you for choosing [facility].   Please call [phone] with any questions about today's appointment.  Please be sure to schedule follow up at the front  desk before you leave today.   Abygail Galeno, DO PGY-3 Family Medicine   

## 2022-04-23 ENCOUNTER — Inpatient Hospital Stay: Payer: Self-pay | Admitting: Family Medicine

## 2022-04-23 DIAGNOSIS — Z13220 Encounter for screening for lipoid disorders: Secondary | ICD-10-CM

## 2022-04-23 DIAGNOSIS — R030 Elevated blood-pressure reading, without diagnosis of hypertension: Secondary | ICD-10-CM

## 2022-04-23 NOTE — Progress Notes (Deleted)
   SUBJECTIVE:   CHIEF COMPLAINT / HPI:  No chief complaint on file.   *** Benjamin Phillips is a 47 y.o. male with a past medical history of *** presenting to the clinic for   T2DM - Last A1c 11.8 on 2/10, repeat in May - Medications: Taking 25 units of Levemir daily, Metformin 500 mg daily - Compliance: Good - Checking BG at home: Fasting sugars range between 90-100 - Eye exam: Due - Foot exam: Due - Microalbumin: Started Losartan, checking urine microalbumin today - Statin: Not on due to previous CK elevation and myalgias - Denies symptoms of hypoglycemia, polyuria, polydipsia, numbness extremities, foot ulcers/trauma    Titrate insulin as appropriate. D/c with 25U Levemir Recommend urine microalbumin for renal function, ophthalmology examination, foot examination  Up-titrate Metformin outpatient; discharged on 500 mg  Repeat A1c in May Lipid panel  Hypertension   BMP in 1 week as he started on Losartan while in the hospital  Hospital Follow Up Benjamin Phillips was recently hospitalized on 2/9 to 2/11 for HHS and a new diagnosis of type 2 diabetes. He was briefly started on Endotool until his anion gap closed. He was transitioned to subcutaneous insulin following. He received education by the diabetes coordinator while hospitalized. He was ultimately discharged on 25U of Levemir daily and started on Metformin XR 500 mg daily. In addition, he was started on Losartan 25 mg given persistently elevated blood pressures in the hospital.    PERTINENT  PMH / PSH: ***  Patient Care Team: Patient, No Pcp Per as PCP - General (General Practice)  OBJECTIVE:   There were no vitals taken for this visit.  General: Age-appropriate, resting comfortably in chair, NAD, WNWD, alert and at baseline. HEENT:  Head: Normocephalic, atraumatic. No tenderness to percussion over sinuses. Eyes: Normal red reflex bilaterally. PERRLA. No conjunctival erythema or scleral injections. Ears: TMs  non-bulging and non-erythematous bilaterally. No erythema of external ear canal. No cerumen impaction. Nose: Non-erythematous turbinates. Mouth/Oral: Clear, no tonsillar exudate. MMM. Neck: Supple. No LAD, thyroid smooth and not palpable. Cardiovascular: Regular rate and rhythm. Normal S1/S2. No murmurs, rubs, or gallops appreciated. 2+ radial pulses. Pulmonary: Clear bilaterally to ascultation. No increased WOB, no accessory muscle usage. No wheezes, crackles, or rhonchi. Abdominal: Normoactive bowel sounds. No tenderness to deep or light palpation. No rebound or guarding. No HSM. Skin: Warm and dry. Extremities: No peripheral edema bilaterally.      No data to display         {Show previous vital signs (optional):23777}  {Labs  Heme  Chem  Endocrine  Serology  Results Review (optional):23779}  ASSESSMENT/PLAN:   No problem-specific Assessment & Plan notes found for this encounter.   No follow-ups on file.  Jerel Sardina Mining engineer, [place]

## 2023-12-28 ENCOUNTER — Other Ambulatory Visit: Payer: Self-pay

## 2023-12-28 ENCOUNTER — Encounter (HOSPITAL_COMMUNITY): Payer: Self-pay

## 2023-12-28 ENCOUNTER — Emergency Department (HOSPITAL_COMMUNITY)
Admission: EM | Admit: 2023-12-28 | Discharge: 2023-12-28 | Disposition: A | Discharge status: Home / Self Care | Payer: Self-pay | Attending: Emergency Medicine | Admitting: Emergency Medicine

## 2023-12-28 DIAGNOSIS — Z7984 Long term (current) use of oral hypoglycemic drugs: Secondary | ICD-10-CM | POA: Insufficient documentation

## 2023-12-28 DIAGNOSIS — S76011A Strain of muscle, fascia and tendon of right hip, initial encounter: Secondary | ICD-10-CM

## 2023-12-28 DIAGNOSIS — I10 Essential (primary) hypertension: Secondary | ICD-10-CM | POA: Insufficient documentation

## 2023-12-28 DIAGNOSIS — X501XXA Overexertion from prolonged static or awkward postures, initial encounter: Secondary | ICD-10-CM | POA: Insufficient documentation

## 2023-12-28 DIAGNOSIS — S76311A Strain of muscle, fascia and tendon of the posterior muscle group at thigh level, right thigh, initial encounter: Secondary | ICD-10-CM | POA: Insufficient documentation

## 2023-12-28 DIAGNOSIS — E119 Type 2 diabetes mellitus without complications: Secondary | ICD-10-CM | POA: Insufficient documentation

## 2023-12-28 DIAGNOSIS — Z79899 Other long term (current) drug therapy: Secondary | ICD-10-CM | POA: Insufficient documentation

## 2023-12-28 DIAGNOSIS — Z794 Long term (current) use of insulin: Secondary | ICD-10-CM | POA: Insufficient documentation

## 2023-12-28 LAB — CBG MONITORING, ED: Glucose-Capillary: 109 mg/dL — ABNORMAL HIGH (ref 70–99)

## 2023-12-28 MED ORDER — LIDOCAINE 5 % EX PTCH: 1.0000 | MEDICATED_PATCH | CUTANEOUS | 0 refills | Status: AC

## 2023-12-28 MED ORDER — CYCLOBENZAPRINE HCL 10 MG PO TABS: 5.0000 mg | ORAL_TABLET | Freq: Every evening | ORAL | 0 refills | Status: AC | PRN

## 2023-12-28 MED ORDER — PREDNISONE 20 MG PO TABS
40.0000 mg | ORAL_TABLET | Freq: Once | ORAL | Status: AC
  Administered 2023-12-28: 40 mg via ORAL
  Filled 2023-12-28: qty 2

## 2023-12-28 MED ORDER — ACETAMINOPHEN 500 MG PO TABS
1000.0000 mg | ORAL_TABLET | Freq: Once | ORAL | Status: AC
  Administered 2023-12-28: 1000 mg via ORAL
  Filled 2023-12-28: qty 2

## 2023-12-28 MED ORDER — PREDNISONE 10 MG PO TABS: ORAL_TABLET | ORAL | 0 refills | Status: AC

## 2023-12-28 NOTE — ED Triage Notes (Signed)
 Patient states that he has had back pain since Saturday that radiates down right leg. Denies injury, but had been moving stuff around. He states that he is unable to sit for long periods due to pain.

## 2023-12-28 NOTE — ED Triage Notes (Signed)
 Pt reports back pain that radiates down his right leg.

## 2023-12-28 NOTE — ED Provider Notes (Signed)
 [place] EMERGENCY DEPARTMENT AT [facility] Provider Note   CSN: [id number] Arrival date & time: 12/28/23  1302     Patient presents with: Back Pain   Benjamin Phillips is a 48 y.o. male with history of hypertension, type 2 diabetes, presents with concern for right lower back pain that started after moving furniture 4 days ago.  He reports the pain is in the right gluteus and also down into the right thigh.  He denies any numbness in the right lower extremity or in the groin.  Denies any bowel or bladder incontinence.  Denies any urinary retention.  Denies any history of IV drug use or malignancy.  Denies any weakness in the lower extremities.    Back Pain      Prior to Admission medications   Medication Sig Start Date End Date Taking? Authorizing Provider  cyclobenzaprine (FLEXERIL) 10 MG tablet Take 0.5-1 tablets (5-10 mg total) by mouth at bedtime as needed for muscle spasms. 12/28/23  Yes Veta Palma, PA-C  lidocaine (LIDODERM) 5 % Place 1 patch onto the skin daily. Remove & Discard patch within 12 hours or as directed by MD 12/28/23  Yes Veta Palma, PA-C  predniSONE (DELTASONE) 10 MG tablet Take 4 tablets (40 mg total) by mouth daily with breakfast for 1 day, THEN 3 tablets (30 mg total) daily with breakfast for 2 days, THEN 2 tablets (20 mg total) daily with breakfast for 2 days, THEN 1 tablet (10 mg total) daily with breakfast for 1 day. 12/29/23 01/04/24 Yes Veta Palma, PA-C  Blood Glucose Monitoring Suppl DEVI 1 each by Does not apply route in the morning, at noon, and at bedtime. May substitute to any manufacturer covered by patient's insurance. 04/19/22   Espinoza, Alejandra, DO  insulin  detemir (LEVEMIR ) 100 UNIT/ML injection Inject 0.25 mLs (25 Units total) into the skin daily. 04/19/22   Christia Budds, MD  Insulin  Pen Needle (PEN NEEDLES) 32G X 4 MM MISC 1 Needle by Does not apply route daily. 04/19/22   Espinoza, Alejandra, DO  losartan   (COZAAR ) 25 MG tablet Take 1 tablet (25 mg total) by mouth daily. 04/19/22   Espinoza, Alejandra, DO  metFORMIN  (GLUCOPHAGE -XR) 500 MG 24 hr tablet Take 1 tablet (500 mg total) by mouth daily with breakfast. 04/19/22   Espinoza, Alejandra, DO    Allergies: Patient has no known allergies.    Review of Systems  Musculoskeletal:  Positive for back pain.    Updated Vital Signs BP (!) 178/91 (BP Location: Right Arm)   Pulse 71   Temp 98.9 F (37.2 C) (Oral)   Resp 16   SpO2 95%   Physical Exam Vitals and nursing note reviewed.  Constitutional:      Appearance: Normal appearance.  HENT:     Head: Atraumatic.  Cardiovascular:     Rate and Rhythm: Normal rate and regular rhythm.     Comments: 2+ pedal pulse in the right lower extremity Pulmonary:     Effort: Pulmonary effort is normal.  Musculoskeletal:     Comments: No cervical, thoracic, or lumbar spinal tenderness to palpation  Tender to palpation over the right gluteal muscle and right quadriceps muscle.  Full range of motion of the bilateral lower extremities.  Ambulates with mild antalgic gait, but no significant difficulty.  No overlying skin change or edema of the right lower extremity  Neurological:     General: No focal deficit present.     Mental Status: He is alert.  Comments: 5/5 strength with resisted hip flexion, knee flexion and extension, ankle plantarflexion and dorsiflexion bilaterally  Intact sensation to the bilateral lower extremities  Psychiatric:        Mood and Affect: Mood normal.        Behavior: Behavior normal.     (all labs ordered are listed, but only abnormal results are displayed) Labs Reviewed  CBG MONITORING, ED - Abnormal; Notable for the following components:      Result Value   Glucose-Capillary 109 (*)    All other components within normal limits    EKG: None  Radiology: No results found.   Procedures   Medications Ordered in the ED  predniSONE (DELTASONE) tablet 40 mg  (has no administration in time range)  acetaminophen  (TYLENOL ) tablet 1,000 mg (has no administration in time range)                                    Medical Decision Making Risk OTC drugs. Prescription drug management.    Differential diagnosis includes Musculoskeletal pain, radiculopathy, spinal stenosis, herniated nucleus pulposis, fracture, cauda equina, epidural abscess, shingles, nephrolithiasis    ED Course:  Upon initial evaluation, patient is well-appearing, no acute distress.  He is reporting right lower back pain that started after moving furniture.  He does not have any cervical, thoracic, or lumbar spinal tenderness to palpation. No known injury. Low concern for vertebral fracture. He is neurovascularly intact in the bilateral lower extremities.  No concern for vascular or spinal cord injury at this time.  He does not have any bowel or bladder continence, no urinary retention, no saddle anesthesia, no history of IV drug use, no history of malignancy.  No indication for imaging at this time.  He is tender along the right gluteal muscle and right hamstring.  Suspect he sustained a muscle strain which is causing his pain.  He is able to ambulate, but with mild antalgic gait.  Reports some pain, radiates into the leg, question if he could have a radicular component to his pain.  Patient is a diabetic.  Checked his blood sugars here given no recent hemoglobin A1c.  CBG here at 109.  Appears he is well-controlled.  Will give a short course of prednisone for suspected radicular pain management at home.  Stable and appropriate for discharge home  Labs Ordered: I Ordered, and personally interpreted labs.  The pertinent results include:   CBG 109  Medications Given: Tylenol   Prednisone  Impression: Right gluteal strain  Disposition:  Patient discharged home with instructions to take Flexeril before bed as needed for pain.  He understands this may make him drowsy and do not  drink alcohol or drive after taking this medication.  Lidocaine patches as needed for pain.  Prednisone course for pain, he understands to monitor his blood sugars while on this medication.  Tylenol  and ibuprofen  as needed for pain.  Establish care with PCP and follow-up with them if pain not improving over the next 2 to 3 weeks. Return precautions given and patient verbalized understanding.   This chart was dictated using voice recognition software, Dragon. Despite the best efforts of this provider to proofread and correct errors, errors may still occur which can change documentation meaning.       Final diagnoses:  Muscle strain of right gluteal region, initial encounter    ED Discharge Orders  Ordered    predniSONE (DELTASONE) 10 MG tablet  Q breakfast        12/28/23 1432    lidocaine (LIDODERM) 5 %  Every 24 hours        12/28/23 1432    cyclobenzaprine (FLEXERIL) 10 MG tablet  At bedtime PRN        12/28/23 1432               Veta Palma, PA-C 12/28/23 1440    Jerrol Agent, MD 12/28/23 1717

## 2023-12-28 NOTE — Discharge Instructions (Addendum)
 Your back pain seems to be combination of the muscle strain as well as some nerve irritation.  This generally takes about 3 to 4 weeks to fully heal on its own.  Please engage in light physical activity (like walking) to prevent your back pain from worsening and to prevent stiffness. Refrain from bedrest which can make your pain worse.   You may use up to 600mg  ibuprofen  every 6 hours as needed for pain.  Do not exceed 2.4g of ibuprofen  per day.    You may take up to 1000mg  of tylenol  every 6 hours as needed for pain.  Do not take more then 4g per day.    You may use a heating pack on your back to help with the pain.  You have been prescribed a muscle relaxer called Flexeril (cyclobenzaprine). You may take 0.5 - 1 tablet (5-10mg ) before bed as needed for muscle pain. This medication can be sedating. Do not drive or operate heavy machinery after taking this medicine. Do not drink alcohol or take other sedating medications when taking this medicine for safety reasons.  Keep this out of reach of small children.    You have been prescribed lidocaine patched to help with pain. You may apply one patch to your back for up to 12 hours at a time. Then, you must remove the patch for a full 12 hours before re-applying a new patch.   You have been prescribed prednisone which is an anti-inflammatory to help your nerve pain. Please take this medication as prescribed for the next 7 days (40mg  on days 1 and 2, 30mg  on days 3 and 4, 20mg  on days 5 and 6, 10mg  on day 7).  You were given your first dose here today.  Take your next dose tomorrow morning.  Take this medication in the morning with breakfast, as taking it at night may make it hard to sleep. If you are a diabetic, please monitor your blood sugars closely on this medication, as it can cause your blood sugar to rise. If your blood sugars rise above 400 and you develop vomiting or are not feeling well, stop taking the medication and go to the ER.     Please  contact your PCP if your back pain does not start to improve over the next 2-3 weeks as he should be reevaluated.    You can also schedule an appointment with a physical therapist to help with your back pain.  Return to the ER if you have loss of bowel or bladder control, you develop fever, you have numbness in your groin, or if you have any other new or concerning symptoms.

## 2023-12-31 ENCOUNTER — Encounter (INDEPENDENT_AMBULATORY_CARE_PROVIDER_SITE_OTHER): Payer: Self-pay

## 2024-01-17 ENCOUNTER — Emergency Department (HOSPITAL_COMMUNITY)
Admission: EM | Admit: 2024-01-17 | Discharge: 2024-01-17 | Disposition: A | Discharge status: Home / Self Care | Payer: Self-pay | Attending: Emergency Medicine | Admitting: Emergency Medicine

## 2024-01-17 ENCOUNTER — Encounter (HOSPITAL_COMMUNITY): Payer: Self-pay

## 2024-01-17 ENCOUNTER — Other Ambulatory Visit: Payer: Self-pay

## 2024-01-17 ENCOUNTER — Emergency Department (HOSPITAL_COMMUNITY): Payer: Self-pay

## 2024-01-17 DIAGNOSIS — I10 Essential (primary) hypertension: Secondary | ICD-10-CM | POA: Insufficient documentation

## 2024-01-17 DIAGNOSIS — Z794 Long term (current) use of insulin: Secondary | ICD-10-CM | POA: Insufficient documentation

## 2024-01-17 DIAGNOSIS — M5440 Lumbago with sciatica, unspecified side: Secondary | ICD-10-CM

## 2024-01-17 DIAGNOSIS — Z7984 Long term (current) use of oral hypoglycemic drugs: Secondary | ICD-10-CM | POA: Insufficient documentation

## 2024-01-17 DIAGNOSIS — Z79899 Other long term (current) drug therapy: Secondary | ICD-10-CM | POA: Insufficient documentation

## 2024-01-17 DIAGNOSIS — E119 Type 2 diabetes mellitus without complications: Secondary | ICD-10-CM | POA: Insufficient documentation

## 2024-01-17 DIAGNOSIS — M5442 Lumbago with sciatica, left side: Secondary | ICD-10-CM | POA: Insufficient documentation

## 2024-01-17 HISTORY — DX: Prediabetes: R73.03

## 2024-01-17 LAB — URINALYSIS, ROUTINE W REFLEX MICROSCOPIC
Bilirubin Urine: NEGATIVE
Glucose, UA: NEGATIVE mg/dL
Hgb urine dipstick: NEGATIVE
Ketones, ur: NEGATIVE mg/dL
Leukocytes,Ua: NEGATIVE
Nitrite: NEGATIVE
Protein, ur: NEGATIVE mg/dL
Specific Gravity, Urine: 1.019 (ref 1.005–1.030)
pH: 6 (ref 5.0–8.0)

## 2024-01-17 LAB — RAPID URINE DRUG SCREEN, HOSP PERFORMED
Amphetamines: NOT DETECTED
Barbiturates: NOT DETECTED
Benzodiazepines: NOT DETECTED
Cocaine: NOT DETECTED
Opiates: NOT DETECTED
Tetrahydrocannabinol: POSITIVE — AB

## 2024-01-17 LAB — CBC
HCT: 39.8 % (ref 39.0–52.0)
Hemoglobin: 13.1 g/dL (ref 13.0–17.0)
MCH: 30.5 pg (ref 26.0–34.0)
MCHC: 32.9 g/dL (ref 30.0–36.0)
MCV: 92.6 fL (ref 80.0–100.0)
Platelets: 329 K/uL (ref 150–400)
RBC: 4.3 MIL/uL (ref 4.22–5.81)
RDW: 13.1 % (ref 11.5–15.5)
WBC: 5.4 K/uL (ref 4.0–10.5)
nRBC: 0 % (ref 0.0–0.2)

## 2024-01-17 LAB — COMPREHENSIVE METABOLIC PANEL WITH GFR
ALT: 18 U/L (ref 0–44)
AST: 21 U/L (ref 15–41)
Albumin: 3.7 g/dL (ref 3.5–5.0)
Alkaline Phosphatase: 82 U/L (ref 38–126)
Anion gap: 13 (ref 5–15)
BUN: 10 mg/dL (ref 6–20)
CO2: 21 mmol/L — ABNORMAL LOW (ref 22–32)
Calcium: 9 mg/dL (ref 8.9–10.3)
Chloride: 106 mmol/L (ref 98–111)
Creatinine, Ser: 0.83 mg/dL (ref 0.61–1.24)
GFR, Estimated: >60 mL/min (ref >60)
Glucose, Bld: 161 mg/dL — ABNORMAL HIGH (ref 70–99)
Potassium: 4.3 mmol/L (ref 3.5–5.1)
Sodium: 140 mmol/L (ref 135–145)
Total Bilirubin: 0.7 mg/dL (ref 0.0–1.2)
Total Protein: 7.5 g/dL (ref 6.5–8.1)

## 2024-01-17 LAB — CBG MONITORING, ED: Glucose-Capillary: 163 mg/dL — ABNORMAL HIGH (ref 70–99)

## 2024-01-17 MED ORDER — CYCLOBENZAPRINE HCL 10 MG PO TABS: 10.0000 mg | ORAL_TABLET | Freq: Two times a day (BID) | ORAL | 0 refills | Status: AC | PRN

## 2024-01-17 MED ORDER — HYDROCODONE-ACETAMINOPHEN 5-325 MG PO TABS: 2.0000 | ORAL_TABLET | ORAL | 0 refills | Status: AC | PRN

## 2024-01-17 MED ORDER — HYDROCODONE-ACETAMINOPHEN 5-325 MG PO TABS
1.0000 | ORAL_TABLET | Freq: Once | ORAL | Status: AC
  Administered 2024-01-17: 1 via ORAL
  Filled 2024-01-17: qty 1

## 2024-01-17 NOTE — Discharge Instructions (Addendum)
 Return for any problem.  As discussed, follow-up closely with Dr. Lanis, [facility] -you may benefit from localized injection in your low back to treat your pain.  As discussed, if you develop worsening pain, fever, difficulty urinating, difficulty having bowel movements, or difficulty ambulating - return immediately to the ED.

## 2024-01-17 NOTE — ED Triage Notes (Signed)
 Pt states he has been having back pain radiating down left leg for past week which he was seen for recently. Pt has been moving stuff. Pt states pain is not better. Pt states he has also had episodes of feeling weak and sweating that started 2 days ago. Pt has right mouth drooping which started 3 days ago. Pt denies numbness or tingling.

## 2024-01-17 NOTE — ED Notes (Signed)
 PER MRI they will be by in a few minutes to get patient.

## 2024-01-17 NOTE — ED Provider Notes (Signed)
 [place] EMERGENCY DEPARTMENT AT [facility] Provider Note   CSN: [id number] Arrival date & time: 01/17/24  9243     Patient presents with: Weakness   Benjamin Phillips is a 48 y.o. male.   48 year old male with prior medical history as detailed below presents for evaluation.  Patient complains of persistent left-sided low back pain with radiation of the pain into his left leg.  Symptoms ongoing for at least a week or 2.  He denies difficulty with urination or bladder or bowel function.  He denies weakness in his left leg.  He reports that lifting increases his pain.  He was seen for same complaint approximately 1 week ago.  He reports that he took a course of prednisone without improvement in symptoms.  Prior medical history includes diabetes, hypertension.  The history is provided by the patient and medical records.       Prior to Admission medications   Medication Sig Start Date End Date Taking? Authorizing Provider  Blood Glucose Monitoring Suppl DEVI 1 each by Does not apply route in the morning, at noon, and at bedtime. May substitute to any manufacturer covered by patient's insurance. 04/19/22   Espinoza, Alejandra, DO  cyclobenzaprine (FLEXERIL) 10 MG tablet Take 0.5-1 tablets (5-10 mg total) by mouth at bedtime as needed for muscle spasms. 12/28/23   Veta Palma, PA-C  insulin  detemir (LEVEMIR ) 100 UNIT/ML injection Inject 0.25 mLs (25 Units total) into the skin daily. 04/19/22   Christia Budds, MD  Insulin  Pen Needle (PEN NEEDLES) 32G X 4 MM MISC 1 Needle by Does not apply route daily. 04/19/22   Espinoza, Alejandra, DO  lidocaine (LIDODERM) 5 % Place 1 patch onto the skin daily. Remove & Discard patch within 12 hours or as directed by MD 12/28/23   Veta Palma, PA-C  losartan  (COZAAR ) 25 MG tablet Take 1 tablet (25 mg total) by mouth daily. 04/19/22   Espinoza, Alejandra, DO  metFORMIN  (GLUCOPHAGE -XR) 500 MG 24 hr tablet Take 1 tablet (500 mg  total) by mouth daily with breakfast. 04/19/22   Espinoza, Alejandra, DO    Allergies: Patient has no known allergies.    Review of Systems  All other systems reviewed and are negative.   Updated Vital Signs BP (!) 170/100 (BP Location: Right Arm)   Pulse 69   Temp 98.4 F (36.9 C)   Resp 17   Ht 6' 2 (1.88 m)   Wt 109.3 kg   SpO2 99%   BMI 30.94 kg/m   Physical Exam Vitals and nursing note reviewed.  Constitutional:      General: He is not in acute distress.    Appearance: He is well-developed.  HENT:     Head: Normocephalic and atraumatic.  Eyes:     Conjunctiva/sclera: Conjunctivae normal.  Cardiovascular:     Rate and Rhythm: Normal rate and regular rhythm.     Heart sounds: No murmur heard. Pulmonary:     Effort: Pulmonary effort is normal. No respiratory distress.     Breath sounds: Normal breath sounds.  Abdominal:     Palpations: Abdomen is soft.     Tenderness: There is no abdominal tenderness.  Musculoskeletal:        General: No swelling.     Cervical back: Neck supple.     Comments: Localizes pain to the left low back.  Distal bilateral lower extremities are neurovascular intact with 5 out of 5 strength.  Normal gait noted.  Skin:  General: Skin is warm and dry.     Capillary Refill: Capillary refill takes less than 2 seconds.  Neurological:     Mental Status: He is alert.  Psychiatric:        Mood and Affect: Mood normal.     (all labs ordered are listed, but only abnormal results are displayed) Labs Reviewed  CBG MONITORING, ED - Abnormal; Notable for the following components:      Result Value   Glucose-Capillary 163 (*)    All other components within normal limits  CBC  URINALYSIS, ROUTINE W REFLEX MICROSCOPIC  COMPREHENSIVE METABOLIC PANEL WITH GFR  RAPID URINE DRUG SCREEN, HOSP PERFORMED    EKG: EKG Interpretation Date/Time:  Monday January 17 2024 08:23:17 EST Ventricular Rate:  57 PR Interval:  154 QRS Duration:  94 QT  Interval:  394 QTC Calculation: 383 R Axis:   21  Text Interpretation: Sinus bradycardia Otherwise normal ECG No previous ECGs available Confirmed by Laurice Coy [phone]) on 01/17/2024 9:06:54 AM  Radiology: No results found.   Procedures   Medications Ordered in the ED - No data to display                                  Medical Decision Making Patient presents with left-sided low back pain with radiculopathy.  This presentation is consistent with sciatica.  However patient is a poorly controlled diabetic.  He reports no improvement with prednisone burst.  MRI obtained.  MRI results discussed with and images reviewed by Dr. Lanis, neurosurgery.  Patient is appropriate for discharge to follow-up with Dr. Lanis in his clinic.  No further use of prednisone is recommended.  Patient is ambulatory and comfortable at time of discharge.  He is prescribed a small amount of hydrocodone and Flexeril for pain control.  Patient understands need to follow-up closely with Dr. Lanis.  Strict return precautions given and understood.  Importance of close follow-up is repeatedly stressed.  Amount and/or Complexity of Data Reviewed Labs: ordered. Radiology: ordered.  Risk Prescription drug management.        Final diagnoses:  Acute left-sided low back pain with sciatica, sciatica laterality unspecified    ED Discharge Orders          Ordered    cyclobenzaprine (FLEXERIL) 10 MG tablet  2 times daily PRN        01/17/24 1514    HYDROcodone-acetaminophen  (NORCO/VICODIN) 5-325 MG tablet  Every 4 hours PRN        01/17/24 1514               Laurice Coy BROCKS, MD 01/17/24 1516

## 2024-01-17 NOTE — ED Provider Triage Note (Signed)
 Emergency Medicine Provider Triage Evaluation Note  Benjamin Phillips , a 48 y.o. male  was evaluated in triage.  Pt complains of persistent left-sided low back pain with radiation of the pain to the left lower leg.   Patient seen for same complaint last week.  He reports that prednisone burst did not improve his symptoms.  He denies bowel or bladder issues.  He is ambulatory without difficulty.  Review of Systems  Positive: Left-sided low back pain with radiation to the left lower extremity Negative: Weakness, fever, bowel or bladder issues  Physical Exam  BP (!) 170/100 (BP Location: Right Arm)   Pulse 69   Temp 98.4 F (36.9 C)   Resp 17   Ht 6' 2 (1.88 m)   Wt 109.3 kg   SpO2 99%   BMI 30.94 kg/m  Gen:   Awake, no distress   Resp:  Normal effort  MSK:   Moves extremities without difficulty  Other:  Normal gait  Medical Decision Making  Medically screening exam initiated at 8:47 AM.  Appropriate orders placed.  KWAN SHELLHAMMER was informed that the remainder of the evaluation will be completed by another provider, this initial triage assessment does not replace that evaluation, and the importance of remaining in the ED until their evaluation is complete.     Laurice Maude BROCKS, MD 01/17/24 [id number]

## 2024-01-17 NOTE — ED Notes (Signed)
 Patient dc by RN. Patient ambulatory to lobby at time of discharge. No additional questions for RN
